# Patient Record
Sex: Male | Born: 1965 | Race: White | Hispanic: No | Smoking: Former smoker
Health system: Southern US, Community
[De-identification: ages and names within clinical notes are randomized; demographics above are authoritative.]

## PROBLEM LIST (undated history)

## (undated) DIAGNOSIS — C8440 Peripheral T-cell lymphoma, not classified, unspecified site: Secondary | ICD-10-CM

## (undated) DIAGNOSIS — J449 Chronic obstructive pulmonary disease, unspecified: Secondary | ICD-10-CM

## (undated) DIAGNOSIS — G894 Chronic pain syndrome: Secondary | ICD-10-CM

## (undated) DIAGNOSIS — C801 Malignant (primary) neoplasm, unspecified: Secondary | ICD-10-CM

## (undated) DIAGNOSIS — K5090 Crohn's disease, unspecified, without complications: Secondary | ICD-10-CM

## (undated) HISTORY — PX: BOWEL RESECTION: SHX1257

---

## 2006-08-21 ENCOUNTER — Emergency Department: Payer: Self-pay | Admitting: Emergency Medicine

## 2010-09-05 ENCOUNTER — Emergency Department: Payer: Self-pay | Admitting: Emergency Medicine

## 2010-10-04 ENCOUNTER — Emergency Department: Payer: Self-pay | Admitting: Unknown Physician Specialty

## 2010-10-12 ENCOUNTER — Inpatient Hospital Stay: Payer: Self-pay | Admitting: Internal Medicine

## 2010-11-06 ENCOUNTER — Emergency Department: Payer: Self-pay | Admitting: Emergency Medicine

## 2011-04-17 ENCOUNTER — Emergency Department: Payer: Self-pay | Admitting: Internal Medicine

## 2019-08-01 ENCOUNTER — Ambulatory Visit: Payer: Self-pay

## 2019-09-01 ENCOUNTER — Telehealth: Payer: Self-pay | Admitting: Primary Care

## 2019-09-01 NOTE — Telephone Encounter (Signed)
Spoke with patient's sister Ronie Spies [facility]) regarding Palliative services and she was in agreement with this.  I have scheduled an In-person Consult for 09/08/19 @ 3 PM

## 2019-09-08 ENCOUNTER — Other Ambulatory Visit: Payer: Medicare Other | Admitting: Primary Care

## 2019-09-09 ENCOUNTER — Other Ambulatory Visit: Payer: Medicare Other | Admitting: Primary Care

## 2019-09-09 ENCOUNTER — Other Ambulatory Visit: Payer: Self-pay

## 2019-09-09 DIAGNOSIS — Z515 Encounter for palliative care: Secondary | ICD-10-CM

## 2019-09-09 NOTE — Progress Notes (Signed)
[facility] Consult Note Telephone: [phone]  Fax: [phone]  PATIENT NAME: Gordon Rubio [address] [place] [address] [phone] (home) [phone] (work) DOB: [birth year] MRN: [id number]  PRIMARY CARE PROVIDER:    Einar Crow, MD,  [id number] Dept of Medicine [place] [address] [phone]  REFERRING PROVIDER:   Einar Crow, MD [id number] Dept of [place],  [place] [address] [phone]  RESPONSIBLE PARTY:   Extended Emergency Contact Information Primary Emergency Contact: BYRD,ROBERTA Address: [address]  [place] Home Phone: [phone] Mobile Phone: [phone] Relation: None Secondary Emergency Contact: AMORE,MISTY D Address: [address]          [place], [place] [address] Home Phone: [phone] Relation: None  I met with patient and family in the home.  ASSESSMENT AND RECOMMENDATIONS:   1. Advance Care Planning/Goals of Care: Goals include to maximize quality of life and symptom management.   I met with Gordon Rubio  and his sister Gordon Rubio, who was willing to discuss advance care planning.  Our advance care planning conversation included a discussion about:   . The value and importance of advance care planning  . Experiences with loved ones who have been seriously ill  . Exploration of personal, cultural or spiritual beliefs that might influence medical decisions   . Exploration of goals of care in the event of a sudden injury or illness  . Identification  of a healthcare agent  . Review of ,and [employer] book given, of an advance directive document . MOST left, he will review . Sister and he recounted care journey.    2. Symptom Management:   Dyspnea: Endorse worsening but also endorses anxiety. Oxygen at 3 L. Nebs at chair side. Discussed pursed lip breathing and Acapella with neb option to combine   resp. therapies. Recommend hydroxyzine in lieu of lorazepam.  Mobility: Using walker with good mobility. Gets out of bed ok (I). Denies falls. Very DOE. Has portable Oxygen but feels afraid to venture very far due to fear of malfunction of concentrator.   3. Family /Caregiver/Community Supports: Lives alone, has caregivers, sister is nearby and [place].  We discussed palliative and hospice as philosophies. He wants to address QOL in care decisions.  4. Cognitive / Functional decline: A and O x 3, able to do bath, dress with DOE. Can do some meal prep but losing stamina. Endorses faigue.  5. Follow up Palliative Care Visit: Palliative care will continue to follow for goals of care clarification and symptom management. Return 4 weeks or prn.  I spent 75 minutes providing this consultation,  from 1500 to 1615 . More than 50% of the time in this consultation was spent coordinating communication.   HISTORY OF PRESENT ILLNESS:  Gordon Rubio is a 54 y.o. year old male with multiple medical problems including COPD, lymphoma, anorexia, fatigue.Palliative Care was asked to follow this patient by consultation request of Zadie Cleverly* to help address advance care planning and goals of care. This is the initial visit.  CODE STATUS: TBD, MOSt left, will discuss  PPS: 40% HOSPICE ELIGIBILITY/DIAGNOSIS: TBD  PAST MEDICAL HISTORY: MI, depression, Crohn's diseaese, COPD, lymphoma, alpha-a antitrypsin deficiency,peripheral neuropathies, AKI, cva.  SOCIAL HX:  Social History   Tobacco Use  . Smoking status: Not on file  Substance Use Topics  . Alcohol use: Not on  file    ALLERGIES:  Allergies  Allergen Reactions  . Doxycycline     Severe arthralgia  . Sulfa Antibiotics     hallucinations    PERTINENT MEDICATIONS:  Outpatient Encounter Medications as of 09/09/2019  Medication Sig  . acetaminophen (TYLENOL) 325 MG tablet Take 650 mg by mouth every 6 (six) hours as needed for pain.  Marland Kitchen  albuterol (PROVENTIL) (2.5 MG/3ML) 0.083% nebulizer solution Inhale 2.5 mg into the lungs every 4 (four) hours as needed. wheezing or shortness of breath  . albuterol (VENTOLIN HFA) 108 (90 Base) MCG/ACT inhaler Inhale 2 puffs into the lungs every 4 (four) hours as needed for wheezing.  Marland Kitchen arformoterol (BROVANA) 15 MCG/2ML NEBU Inhale 15 mcg into the lungs in the morning and at bedtime.  . budesonide (ENTOCORT EC) 3 MG 24 hr capsule Take 9 mg by mouth daily.  . calcitonin, salmon, (MIACALCIN/FORTICAL) 200 UNIT/ACT nasal spray Place 1 spray into alternate nostrils daily.  . calcium citrate (CALCITRATE - DOSED IN MG ELEMENTAL CALCIUM) 950 (200 Ca) MG tablet Take 400 mg of elemental calcium by mouth in the morning and at bedtime.  . colestipol (COLESTID) 1 g tablet Take 1 g by mouth in the morning and at bedtime.  . cyanocobalamin 1000 MCG tablet Take 1,000 mcg by mouth daily.  . ergocalciferol (VITAMIN D2) 1.25 MG (50000 UT) capsule Take 50,000 Units by mouth every 7 (seven) days. On Sunday  . fluconazole (DIFLUCAN) 100 MG tablet Take 200 mg by mouth daily.  . furosemide (LASIX) 20 MG tablet Take 60 mg by mouth daily.  Marland Kitchen LORazepam (ATIVAN) 1 MG tablet Take 0.5 mg by mouth 3 (three) times daily.  . Magnesium Oxide 400 MG CAPS Take 1,600 mg by mouth daily.  . melatonin 3 MG TABS tablet Take 3 mg by mouth at bedtime. As needed for sleep  . mirtazapine (REMERON) 45 MG tablet Take 45 mg by mouth at bedtime.  . Multiple Vitamins-Minerals (THERA-M) TABS Take 1 tablet by mouth daily.  . naloxone (NARCAN) nasal spray 4 mg/0.1 mL Place 4 mg into the nose. AS NEEDED FOR OPIOID OVERDOSE.  Marland Kitchen OLANZapine (ZYPREXA) 10 MG tablet Take 10 mg by mouth at bedtime.  Marland Kitchen OLANZapine (ZYPREXA) 5 MG tablet Take 5 mg by mouth daily.  Marland Kitchen omeprazole (PRILOSEC) 20 MG capsule Take 20 mg by mouth daily.  Marland Kitchen oxyCODONE (OXYCONTIN) 60 MG 12 hr tablet Take 60 mg by mouth every 12 (twelve) hours.  . Oxycodone HCl 10 MG TABS Take 10 mg by  mouth 2 (two) times daily as needed for pain.  . OXYGEN Place 3 L into the nose continuous.  . potassium chloride (KLOR-CON) 10 MEQ tablet Take 10 mEq by mouth daily.  . revefenacin (YUPELRI) 175 MCG/3ML nebulizer solution Inhale 175 mcg into the lungs daily. nebulized  . sertraline (ZOLOFT) 100 MG tablet Take 100 mg by mouth daily.  . sodium chloride HYPERTONIC 3 % nebulizer solution Inhale 4 mLs into the lungs in the morning and at bedtime.  . thiamine 100 MG tablet Take 100 mg by mouth daily.   No facility-administered encounter medications on file as of 09/09/2019.    PHYSICAL EXAM / ROS:   Current and past weights: 116 lbs, states dry weight is 112 lbs.  General: NAD, frail appearing, thin Cardiovascular: no chest pain reported, no edema  Pulmonary: no cough, no increased SOB, oxygen at 3 L no humidity. Nebulizer routine. Tid, also has acapella Abdomen: appetite good, has nutritional supplements  denies constipation, continent of bowel,  GU: denies dysuria, continent of urine MSK:  no joint and ROM abnormalities, ambulatory with walker Skin: no rashes or wounds reported Neurological: Weakness, unrestorative sleep due to dyspnea, anxiety, depression.  Gordon Coop, NP [facility]  COVID-19 PATIENT SCREENING TOOL  Person answering questions: ____________Bill ______ _____   1.  Is the patient or any family member in the home showing any signs or symptoms regarding respiratory infection?               Person with Symptom- __________NA_________________  a. Fever                                                                          Yes___ No___          ___________________  b. Shortness of breath                                                    Yes___ No___          ___________________ c. Cough/congestion                                       Yes___  No___         ___________________ d. Body aches/pains                                                         Yes___ No___         ____________________ e. Gastrointestinal symptoms (diarrhea, nausea)           Yes___ No___        ____________________  2. Within the past 14 days, has anyone living in the home had any contact with someone with or under investigation for COVID-19?    Yes___ No_X_   Person __________________

## 2019-10-07 ENCOUNTER — Other Ambulatory Visit: Payer: Self-pay

## 2019-10-07 ENCOUNTER — Other Ambulatory Visit: Payer: Medicare Other | Admitting: Adult Health Nurse Practitioner

## 2019-10-07 DIAGNOSIS — J449 Chronic obstructive pulmonary disease, unspecified: Secondary | ICD-10-CM

## 2019-10-07 DIAGNOSIS — Z515 Encounter for palliative care: Secondary | ICD-10-CM

## 2019-10-07 NOTE — Progress Notes (Signed)
[facility] Consult Note Telephone: [phone]  Fax: [phone]  PATIENT NAME: Gordon Rubio DOB: Apr 24, 1966 MRN: [id number]  PRIMARY CARE PROVIDER:   Einar Crow, MD  REFERRING PROVIDER:  Einar Crow, MD [id number] Dept of [place],  [place] [address]  RESPONSIBLE PARTY:  Extended Emergency Contact Information Primary Emergency Contact: BYRD,ROBERTA Address: [address]  [place] Home Phone: [phone] Mobile Phone: [phone] Relation: None Secondary Emergency Contact: AMORE,MISTY D Address: [address]          [place], [place] [address] Home Phone: [phone] Relation: None     RECOMMENDATIONS and PLAN:  1.  Advanced care planning.  Patient is DNR.  Went over MOST form today.  Patient selected options for DNR, limited hospital interventions, antibiotics and IV fluids as indicated, no feeding tube.  MOST and DNR filled out today uploaded to [facility] and originals left with patient.  2.  COPD.  Patient has severe COPD with CO2 trapping.  Patient is oxygen dependent at 3 L.  Patient does get headaches and dizziness related to retaining CO2.  Patient currently on budesonide nebulizer daily, Brovana nebulizer twice daily, yulpleri, hypertonic sodium chloride nebulizer, and albuterol nebulizers as needed.  Patient has dyspnea on exertion and has to use techniques to conserve energy.  Patient has worked with OT in the past to help with techniques to conserve energy.  Patient ambulates with walker.  Patient does require assistance with ADLs sometimes.  States that showers are the worst and has to turn up his oxygen to 4 L.  Patient does sit down most of the day and has what he needs close by.  He does state that he becomes dyspneic while eating and talking.  Patient is being seen by Dr. Wynonia Musty with pulmonology.  Continue follow-up and recommendations by Dr. Wynonia Musty  3.   CHF.  Patient checks weight almost daily.  States that most days is around 112 will go up to 116 sometimes.  With the help of his sister has been able to get a good regimen of alternating days where he will take Lasix 20 mg 2 tabs 1 day and then alternate Lasix 20 milligrams 3 tabs the next.  Patient has trace edema today.  Denies chest pain, palpitations, orthopnea.  Patient seems to be doing well with this regimen of Lasix and monitors weights and edema to help make adjustments with his Lasix.  Did discuss taking an extra dose of Lasix with an increase in weight overnight of 3 pounds or more or with an increase in weight of 5 pounds or more in 1 week.  Continue monitoring and making adjustments to Lasix as needed.  4.  Pain.  Patient does have chronic back pain related to compression fractures.  He gets good pain relief with OxyContin 60 mg twice daily and oxycodone 10 mg daily as needed for breakthrough pain.  Continue current pain regimen.  5.  Falls.  Patient did have a fall about a week ago when getting up in the middle the night to go to the bathroom he tripped over his oxygen tubing.  He obtained several skin tears and ecchymosis to right arm which are healing.  Denies any other falls.  Have suggested using urinal at night to help prevent falls when getting up in the middle of the night to use the bathroom.  Patient appears stable at  this time.  Denies fever, increased cough, nausea, vomiting, diarrhea, constipation.  Patient has not had any recent hospitalizations.  Palliative will continue to monitor for symptom management/decline to make recommendations as needed.  Next appointment is in 6 weeks.  I spent 90 minutes providing this consultation,  from 9:00 to 10:30 including time spent with patient/family, chart review, provider coordination, documentation more than 50% of the time in this consultation was spent coordinating communication.   HISTORY OF PRESENT ILLNESS:  Gordon Rubio is a 54  y.o. year old male with multiple medical problems including COPD, lymphoma, CHF, CAD, Chron's disease. Palliative Care was asked to help address goals of care.   CODE STATUS: DNR  PPS: 40% HOSPICE ELIGIBILITY/DIAGNOSIS: TBD  PHYSICAL EXAM:  BP  128/62  HR 98  O2 94%on 3L General: NAD, frail appearing, thin Cardiovascular: regular rate and rhythm Pulmonary: lung sounds diminished but no abnormal breath sounds heard; normal respiratory effort Extremities: no edema, no joint deformities Skin: skin tears and ecchymosis to right arm post fall Neurological: Weakness but otherwise nonfocal   PAST MEDICAL HISTORY: No past medical history on file.  SOCIAL HX:  Social History   Tobacco Use  . Smoking status: Not on file  Substance Use Topics  . Alcohol use: Not on file    ALLERGIES:  Allergies  Allergen Reactions  . Doxycycline     Severe arthralgia  . Sulfa Antibiotics     hallucinations     PERTINENT MEDICATIONS:  Outpatient Encounter Medications as of 10/07/2019  Medication Sig  . acetaminophen (TYLENOL) 325 MG tablet Take 650 mg by mouth every 6 (six) hours as needed for pain.  Marland Kitchen albuterol (PROVENTIL) (2.5 MG/3ML) 0.083% nebulizer solution Inhale 2.5 mg into the lungs every 4 (four) hours as needed. wheezing or shortness of breath  . albuterol (VENTOLIN HFA) 108 (90 Base) MCG/ACT inhaler Inhale 2 puffs into the lungs every 4 (four) hours as needed for wheezing.  Marland Kitchen arformoterol (BROVANA) 15 MCG/2ML NEBU Inhale 15 mcg into the lungs in the morning and at bedtime.  . budesonide (ENTOCORT EC) 3 MG 24 hr capsule Take 9 mg by mouth daily.  . calcitonin, salmon, (MIACALCIN/FORTICAL) 200 UNIT/ACT nasal spray Place 1 spray into alternate nostrils daily.  . calcium citrate (CALCITRATE - DOSED IN MG ELEMENTAL CALCIUM) 950 (200 Ca) MG tablet Take 400 mg of elemental calcium by mouth in the morning and at bedtime.  . colestipol (COLESTID) 1 g tablet Take 1 g by mouth in the morning and at  bedtime.  . cyanocobalamin 1000 MCG tablet Take 1,000 mcg by mouth daily.  . ergocalciferol (VITAMIN D2) 1.25 MG (50000 UT) capsule Take 50,000 Units by mouth every 7 (seven) days. On Sunday  . fluconazole (DIFLUCAN) 100 MG tablet Take 200 mg by mouth daily.  . furosemide (LASIX) 20 MG tablet Take 60 mg by mouth daily.  Marland Kitchen LORazepam (ATIVAN) 1 MG tablet Take 0.5 mg by mouth 3 (three) times daily.  . Magnesium Oxide 400 MG CAPS Take 1,600 mg by mouth daily.  . melatonin 3 MG TABS tablet Take 3 mg by mouth at bedtime. As needed for sleep  . mirtazapine (REMERON) 45 MG tablet Take 45 mg by mouth at bedtime.  . Multiple Vitamins-Minerals (THERA-M) TABS Take 1 tablet by mouth daily.  . naloxone (NARCAN) nasal spray 4 mg/0.1 mL Place 4 mg into the nose. AS NEEDED FOR OPIOID OVERDOSE.  Marland Kitchen OLANZapine (ZYPREXA) 10 MG tablet Take 10 mg by mouth  at bedtime.  Marland Kitchen OLANZapine (ZYPREXA) 5 MG tablet Take 5 mg by mouth daily.  Marland Kitchen omeprazole (PRILOSEC) 20 MG capsule Take 20 mg by mouth daily.  Marland Kitchen oxyCODONE (OXYCONTIN) 60 MG 12 hr tablet Take 60 mg by mouth every 12 (twelve) hours.  . Oxycodone HCl 10 MG TABS Take 10 mg by mouth 2 (two) times daily as needed for pain.  . OXYGEN Place 3 L into the nose continuous.  . potassium chloride (KLOR-CON) 10 MEQ tablet Take 10 mEq by mouth daily.  . revefenacin (YUPELRI) 175 MCG/3ML nebulizer solution Inhale 175 mcg into the lungs daily. nebulized  . sertraline (ZOLOFT) 100 MG tablet Take 100 mg by mouth daily.  . sodium chloride HYPERTONIC 3 % nebulizer solution Inhale 4 mLs into the lungs in the morning and at bedtime.  . thiamine 100 MG tablet Take 100 mg by mouth daily.   No facility-administered encounter medications on file as of 10/07/2019.     Gordon Ouzts Jenetta Downer, NP

## 2019-10-09 ENCOUNTER — Other Ambulatory Visit: Payer: Medicare Other | Admitting: Primary Care

## 2019-11-18 ENCOUNTER — Other Ambulatory Visit: Payer: Self-pay

## 2019-11-18 ENCOUNTER — Other Ambulatory Visit: Payer: Medicare Other | Admitting: Adult Health Nurse Practitioner

## 2019-11-18 DIAGNOSIS — Z515 Encounter for palliative care: Secondary | ICD-10-CM

## 2019-11-18 DIAGNOSIS — J449 Chronic obstructive pulmonary disease, unspecified: Secondary | ICD-10-CM

## 2019-11-18 NOTE — Progress Notes (Signed)
[facility] Consult Note Telephone: [phone]  Fax: [phone]  PATIENT NAME: Gordon Rubio DOB: 01/23/1966 MRN: [id number]  PRIMARY CARE PROVIDER:   Einar Crow, MD  REFERRING PROVIDER:  Einar Crow, MD [id number] Dept of [place],  [place] [address]  RESPONSIBLE PARTY:   Extended Emergency Contact Information Primary Emergency Contact: BYRD,ROBERTA Address: [address] [place], [place] Home Phone: [phone] Mobile Phone: [phone] Relation: None Secondary Emergency Contact: AMORE,MISTY D Address: [address] [place], [place] [address] Home Phone: [phone] Relation: None     RECOMMENDATIONS and PLAN:  1.  Advanced care planning.  Patient is DNR.   2.  COPD stage IV GOLD B.  Patient with severe COPD with CO2 trapping.  Patient is oxygen dependent at 3 L.  Will increase to 4 L when taking a shower.  Patient is complaining of increased dizziness over the past 3 to 4 days along with increased urination.  States that he feels like he is in a fog.  States that urine is clear yellow with no unusual odor.  Denies fever, dysuria, hematuria, low back pain.  Patient has dyspnea with any activity including eating and talking.  Mainly stays in bed or in recliner to conserve energy.  Is able to ambulate with walker.  Uses pursed lipped breathing at rest.  Today's heart rate at rest was 105 and usually stays at 100 or higher at rest.  Requires assistance with ADLs and IADLs.  Have extended hours of caregiver that stays with him.  Patient complains today that he has not been properly instructed on how to use his trilogy machine.  And is having problems when he did skin ask his oxygen from the trilogy that he is not getting any oxygen until he resets his oxygen concentrator.  Have reached out to [facility] to see if someone can come to his home to instruct him on how to properly  use his trilogy machine.  Did later speak with his sister who states that someone from the previous has been there several times but he just has intolerance to using the trilogy machine.  He has had it for over a year and states that due to nonuse the trilogy machine will more than likely be given back to [place].  Sister states that his PCO2 levels are in the 80s and that since he cannot tolerate using the trilogy machine this will just get worse.  Patient's FEV1 is 34%.  Patient does state that he is tired of feeling like "it is the end."  We will reach out to hospice physicians to see if appropriate for hospice.  3.  CHF.  Patient's weight has been stable between 112 and 116 pounds.  He continues alternating his Lasix between 40 and 60 mg every other day.  Patient does have trace edema noted today.  Denies chest pain, palpitations, orthopnea.  He does monitor weights almost daily.  Continue monitoring and making adjustments to Lasix as needed.  Sister has concerns as she realizes the severity of his disease and knows that is not going to get any better especially with him not tolerating the trilogy machine.  States that with his increased forgetfulness and brain fog had had that he has had issues monitoring his medication usage.  Is using a hero machine that dispenses the medications he needs and locks it out after certain amount of time.  She also has concerned that he is  appetite has decreased.  His weights have been stable between 112 pounds to 116 pounds.  Have scheduled an appointment for this Friday afternoon so that patient sister can join [place] and further discuss his disease progression and to further go over goals of care.  I spent 80 minutes providing this consultation,  from 12:00 to 1:20 including time spent with patient/family, chart review, provider coordination, documentation. More than 50% of the time in this consultation was spent coordinating communication.   HISTORY OF PRESENT ILLNESS:   Gordon Rubio is a 54 y.o. year old male with multiple medical problems including COPD, lymphoma, CHF, CAD, Chron's disease. Palliative Care was asked to help address goals of care.   CODE STATUS: DNR  PPS: 40% HOSPICE ELIGIBILITY/DIAGNOSIS: TBD  PHYSICAL EXAM:  BP 110/60  HR 105  O2 98% on 3L General: NAD, frail appearing, thin Cardiovascular: regular rate and rhythm Pulmonary: lung sounds diminished but no abnormal breath sounds heard; using pursed lip breathing Abdomen: soft, nontender, + bowel sounds GU: no suprapubic tenderness Extremities:trace edema, no joint deformities Skin: no rashes on exposed skin Neurological: Weakness but otherwise nonfocal; does have forgetfulness   PAST MEDICAL HISTORY: No past medical history on file.  SOCIAL HX:  Social History   Tobacco Use   Smoking status: Not on file  Substance Use Topics   Alcohol use: Not on file    ALLERGIES:  Allergies  Allergen Reactions   Doxycycline     Severe arthralgia   Sulfa Antibiotics     hallucinations     PERTINENT MEDICATIONS:  Outpatient Encounter Medications as of 11/18/2019  Medication Sig   acetaminophen (TYLENOL) 325 MG tablet Take 650 mg by mouth every 6 (six) hours as needed for pain.   albuterol (PROVENTIL) (2.5 MG/3ML) 0.083% nebulizer solution Inhale 2.5 mg into the lungs every 4 (four) hours as needed. wheezing or shortness of breath   albuterol (VENTOLIN HFA) 108 (90 Base) MCG/ACT inhaler Inhale 2 puffs into the lungs every 4 (four) hours as needed for wheezing.   arformoterol (BROVANA) 15 MCG/2ML NEBU Inhale 15 mcg into the lungs in the morning and at bedtime.   calcitonin, salmon, (MIACALCIN/FORTICAL) 200 UNIT/ACT nasal spray Place 1 spray into alternate nostrils daily.   calcium citrate (CALCITRATE - DOSED IN MG ELEMENTAL CALCIUM) 950 (200 Ca) MG tablet Take 400 mg of elemental calcium by mouth in the morning and at bedtime.   colestipol (COLESTID) 1 g tablet Take 1 g by  mouth in the morning and at bedtime.   cyanocobalamin 1000 MCG tablet Take 1,000 mcg by mouth daily.   ergocalciferol (VITAMIN D2) 1.25 MG (50000 UT) capsule Take 50,000 Units by mouth every 7 (seven) days. On Sunday   fluconazole (DIFLUCAN) 100 MG tablet Take 200 mg by mouth daily.   furosemide (LASIX) 20 MG tablet Take 60 mg by mouth daily.   LORazepam (ATIVAN) 1 MG tablet Take 0.5 mg by mouth 3 (three) times daily.   Magnesium Oxide 400 MG CAPS Take 1,600 mg by mouth daily.   melatonin 3 MG TABS tablet Take 3 mg by mouth at bedtime. As needed for sleep   mirtazapine (REMERON) 45 MG tablet Take 45 mg by mouth at bedtime.   Multiple Vitamins-Minerals (THERA-M) TABS Take 1 tablet by mouth daily.   naloxone (NARCAN) nasal spray 4 mg/0.1 mL Place 4 mg into the nose. AS NEEDED FOR OPIOID OVERDOSE.   OLANZapine (ZYPREXA) 10 MG tablet Take 10 mg by mouth at bedtime.  OLANZapine (ZYPREXA) 5 MG tablet Take 5 mg by mouth daily.   omeprazole (PRILOSEC) 20 MG capsule Take 20 mg by mouth daily.   oxyCODONE (OXYCONTIN) 60 MG 12 hr tablet Take 60 mg by mouth every 12 (twelve) hours.   Oxycodone HCl 10 MG TABS Take 10 mg by mouth 2 (two) times daily as needed for pain.   OXYGEN Place 3 L into the nose continuous.   potassium chloride (KLOR-CON) 10 MEQ tablet Take 10 mEq by mouth daily.   revefenacin (YUPELRI) 175 MCG/3ML nebulizer solution Inhale 175 mcg into the lungs daily. nebulized   sertraline (ZOLOFT) 100 MG tablet Take 100 mg by mouth daily.   thiamine 100 MG tablet Take 100 mg by mouth daily.   No facility-administered encounter medications on file as of 11/18/2019.     Peachie Barkalow Jenetta Downer, NP

## 2019-11-21 ENCOUNTER — Other Ambulatory Visit: Payer: Medicare Other | Admitting: Adult Health Nurse Practitioner

## 2019-11-21 ENCOUNTER — Other Ambulatory Visit: Payer: Self-pay

## 2019-11-21 DIAGNOSIS — Z515 Encounter for palliative care: Secondary | ICD-10-CM

## 2019-11-21 DIAGNOSIS — J449 Chronic obstructive pulmonary disease, unspecified: Secondary | ICD-10-CM

## 2019-11-21 NOTE — Progress Notes (Signed)
[facility] Consult Note Telephone: [phone]  Fax: [phone]  PATIENT NAME: Gordon Rubio DOB: May 14, 1966 MRN: [id number]  PRIMARY CARE PROVIDER:   Einar Crow, MD  REFERRING PROVIDER:  Einar Crow, MD [address] Dept of [place],  [place] [address]  RESPONSIBLE PARTY:   Extended Emergency Contact Information Primary Emergency Contact: BYRD,ROBERTA Address: [place], [place] Home Phone: [phone] Mobile Phone: [phone] Relation: None Secondary Emergency Contact: AMORE,MISTY D Address: [address] [place], [place] [address] Home Phone: [phone] Relation: None    RECOMMENDATIONS and PLAN:  1.  Advanced care planning. Patient is DNR.   2.  COPD.  Patient has stage IV gold B COPD.  Sister states that he has been having several bad days in which he is very short of breath which causes anxiety and increases his shortness of breath.  More episodes of forgetfulness and brain fog.  She states that he is eating less and has lost 5 pounds over the past month and currently weighs 112 pounds down from 117 pounds.  Patient does state that eating is time-consuming and exhausting with the increased shortness of breath.  States that he has increased shortness of breath while talking, eating, drinking, and with activity.  States that when he does eat it will take him an hour to eat a few bites that he is eating.  Will take sips of fluids throughout the day.  States he can only take a few steps before having to sit down and rest.  He uses supplemental oxygen at 3 L.  Has trilogy machine to use at night but he has not been able to tolerate this and does not use it.  Have encouraged them to just send the machine back since is not being used.  Sister does state that over the weekend had an episode where he felt like "everything was upside down" and that he was  dizzy and nauseous.  He feels this way with the CO2 trapping and he is having more episodes like this.  His heart rate during these periods will stay elevated in the 120s and 130s.  Recent change in Ativan to 0.5 mg 3 times daily scheduled.  Visit today was with patient and his sister.  We discussed the severity of his disease and the support that hospice can give him.  Attempted to answer all their questions.  They did have some questions about insurance and medications I was unable to answer and will reach out to someone in hospice who can answer their questions more appropriately and accurately.  This was a very emotional conversation and this provider offered active listening and emotional support.  They are encouraged to call with any questions or concerns.  We will reach out to them next week to see if they have any further questions and if they would like to proceed with hospice support.  I spent 100 minutes providing this consultation,  from 3:30 to 5:10 including time spent with patient/family, chart review, provider coordination, documentation. More than 50% of the time in this consultation was spent coordinating communication.   HISTORY OF PRESENT ILLNESS:  Gordon Rubio is a 54 y.o. year old male with multiple medical problems including COPD, lymphoma,CHF, CAD, Chron's disease. Palliative Care was asked to help address goals of care.   CODE STATUS: DNR  PPS: 40% HOSPICE ELIGIBILITY/DIAGNOSIS: TBD  PHYSICAL EXAM:   General: NAD, frail appearing, thin; does use  pursed lip breathing at rest Extremities: no edema, no joint deformities Skin: no rashes on exposed skin Neurological: Weakness but otherwise nonfocal   PAST MEDICAL HISTORY: No past medical history on file.  SOCIAL HX:  Social History   Tobacco Use   Smoking status: Not on file  Substance Use Topics   Alcohol use: Not on file    ALLERGIES:  Allergies  Allergen Reactions   Doxycycline     Severe arthralgia     Sulfa Antibiotics     hallucinations     PERTINENT MEDICATIONS:  Outpatient Encounter Medications as of 11/21/2019  Medication Sig   acetaminophen (TYLENOL) 325 MG tablet Take 650 mg by mouth every 6 (six) hours as needed for pain.   albuterol (PROVENTIL) (2.5 MG/3ML) 0.083% nebulizer solution Inhale 2.5 mg into the lungs every 4 (four) hours as needed. wheezing or shortness of breath   albuterol (VENTOLIN HFA) 108 (90 Base) MCG/ACT inhaler Inhale 2 puffs into the lungs every 4 (four) hours as needed for wheezing.   arformoterol (BROVANA) 15 MCG/2ML NEBU Inhale 15 mcg into the lungs in the morning and at bedtime.   calcitonin, salmon, (MIACALCIN/FORTICAL) 200 UNIT/ACT nasal spray Place 1 spray into alternate nostrils daily.   calcium citrate (CALCITRATE - DOSED IN MG ELEMENTAL CALCIUM) 950 (200 Ca) MG tablet Take 400 mg of elemental calcium by mouth in the morning and at bedtime.   colestipol (COLESTID) 1 g tablet Take 1 g by mouth in the morning and at bedtime.   cyanocobalamin 1000 MCG tablet Take 1,000 mcg by mouth daily.   ergocalciferol (VITAMIN D2) 1.25 MG (50000 UT) capsule Take 50,000 Units by mouth every 7 (seven) days. On Sunday   fluconazole (DIFLUCAN) 100 MG tablet Take 200 mg by mouth daily.   furosemide (LASIX) 20 MG tablet Take 60 mg by mouth daily.   LORazepam (ATIVAN) 1 MG tablet Take 0.5 mg by mouth 3 (three) times daily.   Magnesium Oxide 400 MG CAPS Take 1,600 mg by mouth daily.   melatonin 3 MG TABS tablet Take 3 mg by mouth at bedtime. As needed for sleep   mirtazapine (REMERON) 45 MG tablet Take 45 mg by mouth at bedtime.   Multiple Vitamins-Minerals (THERA-M) TABS Take 1 tablet by mouth daily.   naloxone (NARCAN) nasal spray 4 mg/0.1 mL Place 4 mg into the nose. AS NEEDED FOR OPIOID OVERDOSE.   OLANZapine (ZYPREXA) 10 MG tablet Take 10 mg by mouth at bedtime.   OLANZapine (ZYPREXA) 5 MG tablet Take 5 mg by mouth daily.   omeprazole (PRILOSEC) 20  MG capsule Take 20 mg by mouth daily.   oxyCODONE (OXYCONTIN) 60 MG 12 hr tablet Take 60 mg by mouth every 12 (twelve) hours.   Oxycodone HCl 10 MG TABS Take 10 mg by mouth 2 (two) times daily as needed for pain.   OXYGEN Place 3 L into the nose continuous.   potassium chloride (KLOR-CON) 10 MEQ tablet Take 10 mEq by mouth daily.   revefenacin (YUPELRI) 175 MCG/3ML nebulizer solution Inhale 175 mcg into the lungs daily. nebulized   sertraline (ZOLOFT) 100 MG tablet Take 100 mg by mouth daily.   thiamine 100 MG tablet Take 100 mg by mouth daily.   No facility-administered encounter medications on file as of 11/21/2019.     Hamzeh Tall Jenetta Downer, NP

## 2019-12-01 ENCOUNTER — Telehealth: Payer: Self-pay | Admitting: Adult Health Nurse Practitioner

## 2019-12-01 NOTE — Telephone Encounter (Signed)
Sister called to discuss that she has spoken with Lorelee Market and Fredia Sorrow with [facility] to get more info about hospice and what her brother's insurance will cover.  He has appointment with pulmonology on 12/03/19 and a telehealth appointment with oncology on 12/12/19.  She would like him to have imaging done to see if the lymphoma has come back as he is having increased fatigue.  Discussed her calling back once imaging done so we could further discuss options moving forward. Deshara Rossi K. Olena Heckle NP

## 2019-12-08 ENCOUNTER — Telehealth: Payer: Self-pay | Admitting: Adult Health Nurse Practitioner

## 2019-12-08 NOTE — Telephone Encounter (Signed)
This is late entry.  Spoke with sister, Angelita Ingles, on 12/05/19.  She updated me on patient's visit with pulmonology and that Dr. Wynonia Musty is in agreement with hospice services as there are no other treatment options for his COPD.  He is not taking this information well and wants to pursue testing with oncology to see if his lymphoma has come back.  Sister to call back with update. Pammy Vesey K. Olena Heckle NP

## 2019-12-29 ENCOUNTER — Telehealth: Payer: Self-pay | Admitting: Adult Health Nurse Practitioner

## 2019-12-29 NOTE — Telephone Encounter (Signed)
Spoke with sister to set up appointment.  Patient has had PET scan done which did not show progression of his lymphoma but did show progression of his Crohn's disease.  Waiting for GI doctor to get back with him with any recommendations.  Set up appointment for 01/07/20 at 4pm.  Encouraged to call with any concerns. Haille Pardi K. Olena Heckle NP

## 2020-01-07 ENCOUNTER — Other Ambulatory Visit: Payer: Self-pay

## 2020-01-07 ENCOUNTER — Other Ambulatory Visit: Payer: Medicare Other | Admitting: Adult Health Nurse Practitioner

## 2020-01-07 DIAGNOSIS — Z515 Encounter for palliative care: Secondary | ICD-10-CM

## 2020-01-07 DIAGNOSIS — J449 Chronic obstructive pulmonary disease, unspecified: Secondary | ICD-10-CM

## 2020-01-08 NOTE — Progress Notes (Signed)
[facility] Consult Note Telephone: [phone]  Fax: [phone]  PATIENT NAME: Gordon Rubio DOB: July 06, 1965 MRN: [id number]  PRIMARY CARE PROVIDER:   Einar Crow, MD  REFERRING PROVIDER:  Einar Crow, MD [id number] Dept of [place],  [place] [address]  RESPONSIBLE PARTY:   Extended Emergency Contact Information Primary Emergency Contact: BYRD,ROBERTA Address: [place], [place] Home Phone: [phone] Mobile Phone: [phone] Relation: None Secondary Emergency Contact: AMORE,MISTY D Address: [place], [place] [address] Home Phone: [phone] Relation: None    RECOMMENDATIONS and PLAN:  1.  Advanced care planning. Patient is DNR.  2.  COPD.  Patient has stage IV gold B COPD. He uses supplemental oxygen at 3 L.  He gets very dyspneic with any activity and eating is exhausting.  He did go out to see a provider about a week ago and is still feeling increased fatigue.  Sister does state that he is sleeping more and stays in bed more often.  Discussed today at length hospice support and services.  Discussed options of staying on palliative or transitioning to hospice.  Answered their questions and promoted free expression of emotional aspect of transitioning to hospice.  Patient would like to stay with palliative at this time and look more into hospice on his own. States that he is not emotionally ready for hospice but feels like he will be in the future.  Will support him for as long as he needs.   Palliative will continue to monitor for symptom management/decline and make recommendations as needed.  Next appointment is in 4 weeks. Encouraged to call with any question or concerns    I spent 60 minutes providing this consultation,  from 4:00 to 5:00 including time spent with patient/family, chart review, provider coordination, documentation.  More than 50% of the time in this consultation was spent coordinating communication.   HISTORY OF PRESENT ILLNESS:  Gordon Rubio is a 54 y.o. year old male with multiple medical problems including COPD, lymphoma,CHF, CAD, Chron's disease. Palliative Care was asked to help address goals of care. In recent appointment with Dr. Wynonia Musty was told that there were no more treatments for his COPD, just symptom management.  Recent PET scan and appointment with oncologist showed that his lymphoma is stable.  Scan did show a couple nodules to left lung but treatment is not an option with as frail as he is.  Did recommend a repeat scan in 3 months to monitor.  Scan also showed that he has inflammation from his Crohn's disease and this is being managed with Entocort.  Is having some diarrhea that started yesterday.  Is having more belching and hiccupping.  Has had some increased coughing today. Denies fever, edema, N/V, dysuria.     CODE STATUS: DNR  PPS: 40% HOSPICE ELIGIBILITY/DIAGNOSIS: yes/severe COPD  PHYSICAL EXAM:   General: NAD, frail appearing, thin Cardiovascular: regular rate and rhythm Pulmonary: lung sounds diminished but no adventitious breath sounds heard Extremities: no edema, no joint deformities Neurological: Weakness but otherwise nonfocal   PAST MEDICAL HISTORY: No past medical history on file.  SOCIAL HX:  Social History   Tobacco Use  . Smoking status: Not on file  Substance Use Topics  . Alcohol use: Not on file    ALLERGIES:  Allergies  Allergen Reactions  . Doxycycline     Severe arthralgia  . Sulfa Antibiotics     hallucinations  PERTINENT MEDICATIONS:  Outpatient Encounter Medications as of 01/07/2020  Medication Sig  . acetaminophen (TYLENOL) 325 MG tablet Take 650 mg by mouth every 6 (six) hours as needed for pain.  Marland Kitchen albuterol (VENTOLIN HFA) 108 (90 Base) MCG/ACT inhaler Inhale 2 puffs into the lungs every 4 (four) hours as needed for wheezing.  Marland Kitchen  arformoterol (BROVANA) 15 MCG/2ML NEBU Inhale 15 mcg into the lungs in the morning and at bedtime.  . calcitonin, salmon, (MIACALCIN/FORTICAL) 200 UNIT/ACT nasal spray Place 1 spray into alternate nostrils daily.  . calcium citrate (CALCITRATE - DOSED IN MG ELEMENTAL CALCIUM) 950 (200 Ca) MG tablet Take 400 mg of elemental calcium by mouth in the morning and at bedtime.  . colestipol (COLESTID) 1 g tablet Take 1 g by mouth in the morning and at bedtime.  . cyanocobalamin 1000 MCG tablet Take 1,000 mcg by mouth daily.  . ergocalciferol (VITAMIN D2) 1.25 MG (50000 UT) capsule Take 50,000 Units by mouth every 7 (seven) days. On Sunday  . fluconazole (DIFLUCAN) 100 MG tablet Take 200 mg by mouth daily.  . furosemide (LASIX) 20 MG tablet Take 60 mg by mouth daily.  Marland Kitchen LORazepam (ATIVAN) 1 MG tablet Take 0.5 mg by mouth 3 (three) times daily.  . Magnesium Oxide 400 MG CAPS Take 1,600 mg by mouth daily.  . melatonin 3 MG TABS tablet Take 3 mg by mouth at bedtime. As needed for sleep  . Multiple Vitamins-Minerals (THERA-M) TABS Take 1 tablet by mouth daily.  . naloxone (NARCAN) nasal spray 4 mg/0.1 mL Place 4 mg into the nose. AS NEEDED FOR OPIOID OVERDOSE.  Marland Kitchen OLANZapine (ZYPREXA) 10 MG tablet Take 10 mg by mouth at bedtime.  Marland Kitchen oxyCODONE (OXYCONTIN) 60 MG 12 hr tablet Take 60 mg by mouth every 12 (twelve) hours.  . Oxycodone HCl 10 MG TABS Take 10 mg by mouth 2 (two) times daily as needed for pain.  . OXYGEN Place 3 L into the nose continuous.  . revefenacin (YUPELRI) 175 MCG/3ML nebulizer solution Inhale 175 mcg into the lungs daily. nebulized  . sertraline (ZOLOFT) 100 MG tablet Take 100 mg by mouth daily.  Marland Kitchen thiamine 100 MG tablet Take 100 mg by mouth daily.   No facility-administered encounter medications on file as of 01/07/2020.     Elizibeth Breau Jenetta Downer, NP

## 2020-02-11 ENCOUNTER — Other Ambulatory Visit: Payer: Self-pay

## 2020-02-11 ENCOUNTER — Other Ambulatory Visit: Payer: Medicare Other | Admitting: Adult Health Nurse Practitioner

## 2020-02-11 DIAGNOSIS — Z515 Encounter for palliative care: Secondary | ICD-10-CM

## 2020-02-11 DIAGNOSIS — J449 Chronic obstructive pulmonary disease, unspecified: Secondary | ICD-10-CM

## 2020-02-11 NOTE — Progress Notes (Signed)
[facility] Consult Note Telephone: [phone]  Fax: [phone]  PATIENT NAME: Gordon Rubio DOB: 03-27-1966 MRN: [id number]  PRIMARY CARE PROVIDER:   Einar Crow, MD  REFERRING PROVIDER:  Einar Crow, MD [address] Dept of [place],  [place] [address]  RESPONSIBLE PARTY:   Extended Emergency Contact Information Primary Emergency Contact: BYRD,ROBERTA Address: [address] [place], [place] Home Phone: [phone] Mobile Phone: [phone] Relation: None Secondary Emergency Contact: AMORE,MISTY D Address: [place], [place] [address] Home Phone: [phone] Relation: None    RECOMMENDATIONS and PLAN:  1.  Advanced care planning. Patient is DNR.  2.  COPD. Patient has stage IV gold B COPD. He uses supplemental oxygen at 3 L.  He gets very dyspneic with any activity and eating is exhausting.  He is having more bad days than good days.  Spends a lot of time in bed and bathing is exhausting.  Encouraged to allow caregiver that comes in to help him with bathing to conserve energy.    3.  Nutritional status.  Patient has lost weight from 116 to 108.  States that his lower jaw protrudes outward and this has caused a misalignment of his teeth and does cause some pain with chewing.  Has been eating more soft foods.    Patient has appointment with PCP, Dr Durward Parcel, on 02/27/20 to discuss his condition and option of hospice.  Patient has choices of who to be attending once he goes to hospice as his some of his specialists have already said that they would be willing to be his attending.  Have appointment after scheduled in 2 1/2 weeks to discuss hospice further.    I spent 50 minutes providing this consultation,  from 4:00 to 4:50 including time spent with patient/family, chart review, provider coordination, documentation. More than 50% of the time in this  consultation was spent coordinating communication.   HISTORY OF PRESENT ILLNESS:  Gordon Rubio is a 54 y.o. year old male with multiple medical problems including COPD, lymphoma,CHF, CAD, Chron's disease. Palliative Care was asked to help address goals of care.  Patient has weak bones. Patient having displacement of lower jaw and also has caused a rib fracture when he put his arm behind his back. Already on Oxycontin for pain relief.  Patient having increased exhaustion and SOB with any activity.  Having longer periods in which he sleeps.  More days of staying in bed.    CODE STATUS: DNR  PPS: 40% HOSPICE ELIGIBILITY/DIAGNOSIS: yes/ end stage COPD  PHYSICAL EXAM:  BP  112/60  HR 103  O2 96% on 3L General: NAD, frail appearing, thin Cardiovascular: regular rate and rhythm Pulmonary: lung sounds diminished but no adventitious breath sounds heard Extremities: no edema, no joint deformities Skin: no rashes on exposed skin Neurological: Weakness but otherwise nonfocal   PAST MEDICAL HISTORY: No past medical history on file.  SOCIAL HX:  Social History   Tobacco Use  . Smoking status: Not on file  Substance Use Topics  . Alcohol use: Not on file    ALLERGIES:  Allergies  Allergen Reactions  . Doxycycline     Severe arthralgia  . Sulfa Antibiotics     hallucinations     PERTINENT MEDICATIONS:  Outpatient Encounter Medications as of 02/11/2020  Medication Sig  . acetaminophen (TYLENOL) 325 MG tablet Take 650 mg by mouth every 6 (six) hours as needed for pain.  Marland Kitchen  albuterol (PROVENTIL) (2.5 MG/3ML) 0.083% nebulizer solution Inhale 2.5 mg into the lungs every 4 (four) hours as needed. wheezing or shortness of breath  . albuterol (VENTOLIN HFA) 108 (90 Base) MCG/ACT inhaler Inhale 2 puffs into the lungs every 4 (four) hours as needed for wheezing.  Marland Kitchen arformoterol (BROVANA) 15 MCG/2ML NEBU Inhale 15 mcg into the lungs in the morning and at bedtime.  . calcitonin, salmon,  (MIACALCIN/FORTICAL) 200 UNIT/ACT nasal spray Place 1 spray into alternate nostrils daily.  . calcium citrate (CALCITRATE - DOSED IN MG ELEMENTAL CALCIUM) 950 (200 Ca) MG tablet Take 400 mg of elemental calcium by mouth in the morning and at bedtime.  . colestipol (COLESTID) 1 g tablet Take 1 g by mouth in the morning and at bedtime.  . cyanocobalamin 1000 MCG tablet Take 1,000 mcg by mouth daily.  . ergocalciferol (VITAMIN D2) 1.25 MG (50000 UT) capsule Take 50,000 Units by mouth every 7 (seven) days. On Sunday  . fluconazole (DIFLUCAN) 100 MG tablet Take 200 mg by mouth daily.  . furosemide (LASIX) 20 MG tablet Take 60 mg by mouth daily.  Marland Kitchen LORazepam (ATIVAN) 1 MG tablet Take 0.5 mg by mouth 3 (three) times daily.  . Magnesium Oxide 400 MG CAPS Take 1,600 mg by mouth daily.  . melatonin 3 MG TABS tablet Take 3 mg by mouth at bedtime. As needed for sleep  . mirtazapine (REMERON) 45 MG tablet Take 45 mg by mouth at bedtime.  . Multiple Vitamins-Minerals (THERA-M) TABS Take 1 tablet by mouth daily.  . naloxone (NARCAN) nasal spray 4 mg/0.1 mL Place 4 mg into the nose. AS NEEDED FOR OPIOID OVERDOSE.  Marland Kitchen OLANZapine (ZYPREXA) 10 MG tablet Take 10 mg by mouth at bedtime.  Marland Kitchen OLANZapine (ZYPREXA) 5 MG tablet Take 5 mg by mouth daily.  Marland Kitchen omeprazole (PRILOSEC) 20 MG capsule Take 20 mg by mouth daily.  Marland Kitchen oxyCODONE (OXYCONTIN) 60 MG 12 hr tablet Take 60 mg by mouth every 12 (twelve) hours.  . Oxycodone HCl 10 MG TABS Take 10 mg by mouth 2 (two) times daily as needed for pain.  . OXYGEN Place 3 L into the nose continuous.  . potassium chloride (KLOR-CON) 10 MEQ tablet Take 10 mEq by mouth daily.  . revefenacin (YUPELRI) 175 MCG/3ML nebulizer solution Inhale 175 mcg into the lungs daily. nebulized  . sertraline (ZOLOFT) 100 MG tablet Take 100 mg by mouth daily.  Marland Kitchen thiamine 100 MG tablet Take 100 mg by mouth daily.   No facility-administered encounter medications on file as of 02/11/2020.     Cathan Gearin Jenetta Downer, NP

## 2020-02-27 ENCOUNTER — Telehealth: Payer: Self-pay | Admitting: Adult Health Nurse Practitioner

## 2020-02-27 NOTE — Telephone Encounter (Signed)
This is late entry.  Spoke with patient's sister yesterday.  Patient had visit with his PCP, Dr. Danise Edge.  She had advised patient that hospice would be good support for him.  Patient continues to sleep more and has lost about 6 more pounds and currently weighs 106 pounds from baseline of 112-117.  He is on board with hospice services now.  Have reached out to Dr. Babs Sciara office with request for hospice referral and if he will continue to be attending. Bennett Ram K. Olena Heckle NP

## 2020-03-01 ENCOUNTER — Other Ambulatory Visit: Payer: Medicare Other | Admitting: Adult Health Nurse Practitioner

## 2020-03-01 ENCOUNTER — Other Ambulatory Visit: Payer: Self-pay

## 2020-03-01 DIAGNOSIS — Z515 Encounter for palliative care: Secondary | ICD-10-CM

## 2020-03-01 DIAGNOSIS — J449 Chronic obstructive pulmonary disease, unspecified: Secondary | ICD-10-CM

## 2020-03-01 NOTE — Progress Notes (Signed)
[facility] Consult Note Telephone: [phone]  Fax: [phone]  PATIENT NAME: Gordon Rubio DOB: [birth year] MRN: [id number]  PRIMARY CARE PROVIDER:   Einar Crow, MD  REFERRING PROVIDER:  Einar Crow, MD [id number] Dept of [place],  [place] [address]  RESPONSIBLE PARTY:  Extended Emergency Contact Information Primary Emergency Contact: BYRD,ROBERTA Address: [address] [place], [place] Home Phone: [phone] Mobile Phone: [phone] Relation: None Secondary Emergency Contact: AMORE,MISTY D Address: [place] [place], [place] [address] Home Phone: [phone] Relation: None    RECOMMENDATIONS and PLAN:  1.  Advanced care planning. Patient is DNR.  2.  This visit today was with hospice admission nurse.  Joined hospice nurse, patient, and family for visit at request of family.  Helped to answer questions about services, medications, and offered support.  Patient admitting to hospice services today.   I spent 60 minutes providing this consultation,  from 1:00 to 2:00 including time spent with patient/family, chart review, provider coordination, documentation. More than 50% of the time in this consultation was spent coordinating communication.   HISTORY OF PRESENT ILLNESS:  Gordon Rubio is a 54 y.o. year old male with multiple medical problems including COPD, lymphoma,CHF, CAD, Chron's disease. Palliative Care was asked to help address goals of care.  Patient continuing to have increased dyspnea with eating and talking.  Sleeping more.    CODE STATUS: DNR  PPS: 40% HOSPICE ELIGIBILITY/DIAGNOSIS: yes/end stage COPD  PHYSICAL EXAM:   General: NAD, frail appearing, thin Extremities: no edema, no joint deformities Skin: no rashes on exposed skin Neurological: Weakness but otherwise nonfocal  PAST MEDICAL HISTORY: No past medical history on file.   SOCIAL HX:  Social History   Tobacco Use  . Smoking status: Not on file  Substance Use Topics  . Alcohol use: Not on file    ALLERGIES:  Allergies  Allergen Reactions  . Doxycycline     Severe arthralgia  . Sulfa Antibiotics     hallucinations     PERTINENT MEDICATIONS:  Outpatient Encounter Medications as of 03/01/2020  Medication Sig  . acetaminophen (TYLENOL) 325 MG tablet Take 650 mg by mouth every 6 (six) hours as needed for pain.  Marland Kitchen albuterol (PROVENTIL) (2.5 MG/3ML) 0.083% nebulizer solution Inhale 2.5 mg into the lungs every 4 (four) hours as needed. wheezing or shortness of breath  . albuterol (VENTOLIN HFA) 108 (90 Base) MCG/ACT inhaler Inhale 2 puffs into the lungs every 4 (four) hours as needed for wheezing.  Marland Kitchen arformoterol (BROVANA) 15 MCG/2ML NEBU Inhale 15 mcg into the lungs in the morning and at bedtime.  . calcitonin, salmon, (MIACALCIN/FORTICAL) 200 UNIT/ACT nasal spray Place 1 spray into alternate nostrils daily.  . calcium citrate (CALCITRATE - DOSED IN MG ELEMENTAL CALCIUM) 950 (200 Ca) MG tablet Take 400 mg of elemental calcium by mouth in the morning and at bedtime.  . colestipol (COLESTID) 1 g tablet Take 1 g by mouth in the morning and at bedtime.  . cyanocobalamin 1000 MCG tablet Take 1,000 mcg by mouth daily.  . ergocalciferol (VITAMIN D2) 1.25 MG (50000 UT) capsule Take 50,000 Units by mouth every 7 (seven) days. On Sunday  . fluconazole (DIFLUCAN) 100 MG tablet Take 200 mg by mouth daily.  . furosemide (LASIX) 20 MG tablet Take 60 mg by mouth daily.  Marland Kitchen LORazepam (ATIVAN) 1 MG tablet Take 0.5 mg by mouth 3 (three) times daily.  . Magnesium Oxide  400 MG CAPS Take 1,600 mg by mouth daily.  . melatonin 3 MG TABS tablet Take 3 mg by mouth at bedtime. As needed for sleep  . mirtazapine (REMERON) 45 MG tablet Take 45 mg by mouth at bedtime.  . Multiple Vitamins-Minerals (THERA-M) TABS Take 1 tablet by mouth daily.  . naloxone (NARCAN) nasal spray 4 mg/0.1  mL Place 4 mg into the nose. AS NEEDED FOR OPIOID OVERDOSE.  Marland Kitchen OLANZapine (ZYPREXA) 10 MG tablet Take 10 mg by mouth at bedtime.  Marland Kitchen OLANZapine (ZYPREXA) 5 MG tablet Take 5 mg by mouth daily.  Marland Kitchen omeprazole (PRILOSEC) 20 MG capsule Take 20 mg by mouth daily.  Marland Kitchen oxyCODONE (OXYCONTIN) 60 MG 12 hr tablet Take 60 mg by mouth every 12 (twelve) hours.  . Oxycodone HCl 10 MG TABS Take 10 mg by mouth 2 (two) times daily as needed for pain.  . OXYGEN Place 3 L into the nose continuous.  . potassium chloride (KLOR-CON) 10 MEQ tablet Take 10 mEq by mouth daily.  . revefenacin (YUPELRI) 175 MCG/3ML nebulizer solution Inhale 175 mcg into the lungs daily. nebulized  . sertraline (ZOLOFT) 100 MG tablet Take 100 mg by mouth daily.  Marland Kitchen thiamine 100 MG tablet Take 100 mg by mouth daily.   No facility-administered encounter medications on file as of 03/01/2020.      Kyilee Gregg Jenetta Downer, NP

## 2020-04-16 ENCOUNTER — Other Ambulatory Visit: Payer: Self-pay

## 2020-04-16 ENCOUNTER — Inpatient Hospital Stay
Admission: EM | Admit: 2020-04-16 | Discharge: 2020-04-22 | DRG: 480 | Disposition: A | Discharge status: Skilled Nursing Facility | Source: Skilled Nursing Facility | Attending: Family Medicine | Admitting: Family Medicine

## 2020-04-16 ENCOUNTER — Emergency Department

## 2020-04-16 ENCOUNTER — Encounter: Payer: Self-pay | Admitting: Medical Oncology

## 2020-04-16 DIAGNOSIS — E8801 Alpha-1-antitrypsin deficiency: Secondary | ICD-10-CM | POA: Diagnosis present

## 2020-04-16 DIAGNOSIS — M8088XA Other osteoporosis with current pathological fracture, vertebra(e), initial encounter for fracture: Secondary | ICD-10-CM | POA: Diagnosis present

## 2020-04-16 DIAGNOSIS — W19XXXA Unspecified fall, initial encounter: Secondary | ICD-10-CM

## 2020-04-16 DIAGNOSIS — E876 Hypokalemia: Secondary | ICD-10-CM

## 2020-04-16 DIAGNOSIS — M069 Rheumatoid arthritis, unspecified: Secondary | ICD-10-CM | POA: Diagnosis present

## 2020-04-16 DIAGNOSIS — Z87891 Personal history of nicotine dependence: Secondary | ICD-10-CM

## 2020-04-16 DIAGNOSIS — G9341 Metabolic encephalopathy: Secondary | ICD-10-CM | POA: Diagnosis present

## 2020-04-16 DIAGNOSIS — Z87442 Personal history of urinary calculi: Secondary | ICD-10-CM

## 2020-04-16 DIAGNOSIS — F418 Other specified anxiety disorders: Secondary | ICD-10-CM | POA: Diagnosis not present

## 2020-04-16 DIAGNOSIS — K08109 Complete loss of teeth, unspecified cause, unspecified class: Secondary | ICD-10-CM | POA: Diagnosis present

## 2020-04-16 DIAGNOSIS — K529 Noninfective gastroenteritis and colitis, unspecified: Secondary | ICD-10-CM | POA: Diagnosis present

## 2020-04-16 DIAGNOSIS — Z20822 Contact with and (suspected) exposure to covid-19: Secondary | ICD-10-CM | POA: Diagnosis present

## 2020-04-16 DIAGNOSIS — Z9981 Dependence on supplemental oxygen: Secondary | ICD-10-CM

## 2020-04-16 DIAGNOSIS — Z681 Body mass index (BMI) 19 or less, adult: Secondary | ICD-10-CM

## 2020-04-16 DIAGNOSIS — F32A Depression, unspecified: Secondary | ICD-10-CM | POA: Diagnosis present

## 2020-04-16 DIAGNOSIS — E43 Unspecified severe protein-calorie malnutrition: Secondary | ICD-10-CM | POA: Diagnosis present

## 2020-04-16 DIAGNOSIS — Z882 Allergy status to sulfonamides status: Secondary | ICD-10-CM

## 2020-04-16 DIAGNOSIS — Z881 Allergy status to other antibiotic agents status: Secondary | ICD-10-CM

## 2020-04-16 DIAGNOSIS — Z825 Family history of asthma and other chronic lower respiratory diseases: Secondary | ICD-10-CM

## 2020-04-16 DIAGNOSIS — G894 Chronic pain syndrome: Secondary | ICD-10-CM | POA: Diagnosis present

## 2020-04-16 DIAGNOSIS — Z66 Do not resuscitate: Secondary | ICD-10-CM | POA: Diagnosis present

## 2020-04-16 DIAGNOSIS — J9611 Chronic respiratory failure with hypoxia: Secondary | ICD-10-CM | POA: Diagnosis present

## 2020-04-16 DIAGNOSIS — Z79899 Other long term (current) drug therapy: Secondary | ICD-10-CM

## 2020-04-16 DIAGNOSIS — W010XXA Fall on same level from slipping, tripping and stumbling without subsequent striking against object, initial encounter: Secondary | ICD-10-CM | POA: Diagnosis present

## 2020-04-16 DIAGNOSIS — I959 Hypotension, unspecified: Secondary | ICD-10-CM | POA: Diagnosis not present

## 2020-04-16 DIAGNOSIS — S72141A Displaced intertrochanteric fracture of right femur, initial encounter for closed fracture: Principal | ICD-10-CM | POA: Diagnosis present

## 2020-04-16 DIAGNOSIS — C8440 Peripheral T-cell lymphoma, not classified, unspecified site: Secondary | ICD-10-CM | POA: Diagnosis present

## 2020-04-16 DIAGNOSIS — D62 Acute posthemorrhagic anemia: Secondary | ICD-10-CM | POA: Diagnosis not present

## 2020-04-16 DIAGNOSIS — I5021 Acute systolic (congestive) heart failure: Secondary | ICD-10-CM | POA: Diagnosis not present

## 2020-04-16 DIAGNOSIS — E875 Hyperkalemia: Secondary | ICD-10-CM | POA: Diagnosis not present

## 2020-04-16 DIAGNOSIS — S72001A Fracture of unspecified part of neck of right femur, initial encounter for closed fracture: Secondary | ICD-10-CM | POA: Diagnosis not present

## 2020-04-16 DIAGNOSIS — K5090 Crohn's disease, unspecified, without complications: Secondary | ICD-10-CM | POA: Diagnosis present

## 2020-04-16 DIAGNOSIS — S72001D Fracture of unspecified part of neck of right femur, subsequent encounter for closed fracture with routine healing: Secondary | ICD-10-CM | POA: Diagnosis not present

## 2020-04-16 DIAGNOSIS — F419 Anxiety disorder, unspecified: Secondary | ICD-10-CM | POA: Diagnosis present

## 2020-04-16 DIAGNOSIS — J431 Panlobular emphysema: Secondary | ICD-10-CM | POA: Diagnosis present

## 2020-04-16 DIAGNOSIS — J449 Chronic obstructive pulmonary disease, unspecified: Secondary | ICD-10-CM

## 2020-04-16 DIAGNOSIS — R0602 Shortness of breath: Secondary | ICD-10-CM

## 2020-04-16 HISTORY — DX: Crohn's disease, unspecified, without complications: K50.90

## 2020-04-16 HISTORY — DX: Chronic pain syndrome: G89.4

## 2020-04-16 HISTORY — DX: Malignant (primary) neoplasm, unspecified: C80.1

## 2020-04-16 HISTORY — DX: Chronic obstructive pulmonary disease, unspecified: J44.9

## 2020-04-16 HISTORY — DX: Peripheral t-cell lymphoma, not elsewhere classified, unspecified site: C84.40

## 2020-04-16 LAB — RESP PANEL BY RT-PCR (FLU A&B, COVID) ARPGX2
Influenza A by PCR: NEGATIVE
Influenza B by PCR: NEGATIVE
SARS Coronavirus 2 by RT PCR: NEGATIVE

## 2020-04-16 LAB — COMPREHENSIVE METABOLIC PANEL
ALT: 18 U/L (ref 0–44)
AST: 33 U/L (ref 15–41)
Albumin: 2.1 g/dL — ABNORMAL LOW (ref 3.5–5.0)
Alkaline Phosphatase: 116 U/L (ref 38–126)
Anion gap: 15 (ref 5–15)
BUN: 7 mg/dL (ref 6–20)
CO2: 40 mmol/L — ABNORMAL HIGH (ref 22–32)
Calcium: 5.4 mg/dL — CL (ref 8.9–10.3)
Chloride: 79 mmol/L — ABNORMAL LOW (ref 98–111)
Creatinine, Ser: 0.64 mg/dL (ref 0.61–1.24)
GFR, Estimated: >60 mL/min (ref >60)
Glucose, Bld: 126 mg/dL — ABNORMAL HIGH (ref 70–99)
Potassium: 2.2 mmol/L — CL (ref 3.5–5.1)
Sodium: 134 mmol/L — ABNORMAL LOW (ref 135–145)
Total Bilirubin: 0.7 mg/dL (ref 0.3–1.2)
Total Protein: 5.1 g/dL — ABNORMAL LOW (ref 6.5–8.1)

## 2020-04-16 LAB — TYPE AND SCREEN
ABO/RH(D): A POS
Antibody Screen: NEGATIVE

## 2020-04-16 LAB — MAGNESIUM: Magnesium: 0.6 mg/dL — CL (ref 1.7–2.4)

## 2020-04-16 LAB — CBC
HCT: 31.7 % — ABNORMAL LOW (ref 39.0–52.0)
Hemoglobin: 9.9 g/dL — ABNORMAL LOW (ref 13.0–17.0)
MCH: 27.4 pg (ref 26.0–34.0)
MCHC: 31.2 g/dL (ref 30.0–36.0)
MCV: 87.8 fL (ref 80.0–100.0)
Platelets: 175 10*3/uL (ref 150–400)
RBC: 3.61 MIL/uL — ABNORMAL LOW (ref 4.22–5.81)
RDW: 12.7 % (ref 11.5–15.5)
WBC: 7.5 10*3/uL (ref 4.0–10.5)
nRBC: 0 % (ref 0.0–0.2)

## 2020-04-16 LAB — APTT: aPTT: 28 seconds (ref 24–36)

## 2020-04-16 LAB — BRAIN NATRIURETIC PEPTIDE: B Natriuretic Peptide: 50.3 pg/mL (ref 0.0–100.0)

## 2020-04-16 LAB — PROTIME-INR
INR: 1 (ref 0.8–1.2)
Prothrombin Time: 12.7 seconds (ref 11.4–15.2)

## 2020-04-16 LAB — PHOSPHORUS: Phosphorus: 2.8 mg/dL (ref 2.5–4.6)

## 2020-04-16 MED ORDER — DM-GUAIFENESIN ER 30-600 MG PO TB12
1.0000 | ORAL_TABLET | Freq: Two times a day (BID) | ORAL | Status: DC | PRN
  Filled 2020-04-16: qty 1

## 2020-04-16 MED ORDER — SODIUM CHLORIDE 0.9 % IV BOLUS
1000.0000 mL | Freq: Once | INTRAVENOUS | Status: AC
  Administered 2020-04-16: 1000 mL via INTRAVENOUS

## 2020-04-16 MED ORDER — ACETAMINOPHEN 325 MG PO TABS: 650.0000 mg | ORAL_TABLET | Freq: Four times a day (QID) | ORAL | Status: DC | PRN

## 2020-04-16 MED ORDER — MAGNESIUM SULFATE 2 GM/50ML IV SOLN
2.0000 g | Freq: Once | INTRAVENOUS | Status: AC
  Administered 2020-04-16: 2 g via INTRAVENOUS
  Filled 2020-04-16: qty 50

## 2020-04-16 MED ORDER — POTASSIUM CHLORIDE CRYS ER 20 MEQ PO TBCR
40.0000 meq | EXTENDED_RELEASE_TABLET | ORAL | Status: AC
  Administered 2020-04-16 (×2): 40 meq via ORAL
  Filled 2020-04-16 (×2): qty 2

## 2020-04-16 MED ORDER — HYDROMORPHONE HCL 1 MG/ML IJ SOLN
1.0000 mg | Freq: Once | INTRAMUSCULAR | Status: AC
  Administered 2020-04-16: 1 mg via INTRAVENOUS
  Filled 2020-04-16: qty 1

## 2020-04-16 MED ORDER — ONDANSETRON HCL 4 MG/2ML IJ SOLN
4.0000 mg | Freq: Once | INTRAMUSCULAR | Status: AC
  Administered 2020-04-16: 4 mg via INTRAVENOUS
  Filled 2020-04-16: qty 2

## 2020-04-16 MED ORDER — METHOCARBAMOL 500 MG PO TABS
500.0000 mg | ORAL_TABLET | Freq: Three times a day (TID) | ORAL | Status: DC | PRN
  Administered 2020-04-17 – 2020-04-19 (×3): 500 mg via ORAL
  Filled 2020-04-16 (×4): qty 1

## 2020-04-16 MED ORDER — MORPHINE SULFATE (PF) 2 MG/ML IV SOLN
1.0000 mg | INTRAVENOUS | Status: DC | PRN
  Administered 2020-04-16 – 2020-04-18 (×6): 1 mg via INTRAVENOUS
  Filled 2020-04-16 (×6): qty 1

## 2020-04-16 MED ORDER — POTASSIUM CHLORIDE 10 MEQ/100ML IV SOLN
10.0000 meq | INTRAVENOUS | Status: AC
  Administered 2020-04-16: 10 meq via INTRAVENOUS
  Filled 2020-04-16: qty 100

## 2020-04-16 MED ORDER — ALBUTEROL SULFATE HFA 108 (90 BASE) MCG/ACT IN AERS
2.0000 | INHALATION_SPRAY | RESPIRATORY_TRACT | Status: DC | PRN
  Administered 2020-04-17: 2 via RESPIRATORY_TRACT
  Filled 2020-04-16 (×2): qty 6.7

## 2020-04-16 MED ORDER — OXYCODONE-ACETAMINOPHEN 5-325 MG PO TABS
1.0000 | ORAL_TABLET | Freq: Four times a day (QID) | ORAL | Status: DC | PRN
  Administered 2020-04-16 – 2020-04-17 (×2): 1 via ORAL
  Filled 2020-04-16 (×2): qty 1

## 2020-04-16 MED ORDER — ONDANSETRON HCL 4 MG/2ML IJ SOLN: 4.0000 mg | Freq: Three times a day (TID) | INTRAMUSCULAR | Status: DC | PRN

## 2020-04-16 MED ORDER — HYDROMORPHONE HCL 1 MG/ML IJ SOLN
1.0000 mg | INTRAMUSCULAR | Status: AC
  Administered 2020-04-16: 1 mg via INTRAVENOUS
  Filled 2020-04-16: qty 1

## 2020-04-16 MED ORDER — ENSURE ENLIVE PO LIQD
237.0000 mL | Freq: Two times a day (BID) | ORAL | Status: DC
  Administered 2020-04-17 – 2020-04-19 (×4): 237 mL via ORAL

## 2020-04-16 MED ORDER — POTASSIUM CHLORIDE 10 MEQ/100ML IV SOLN: 10.0000 meq | Freq: Once | INTRAVENOUS | Status: DC

## 2020-04-16 MED ORDER — MORPHINE SULFATE (PF) 2 MG/ML IV SOLN
2.0000 mg | Freq: Once | INTRAVENOUS | Status: AC
  Administered 2020-04-16: 2 mg via SUBCUTANEOUS
  Filled 2020-04-16: qty 1

## 2020-04-16 MED ORDER — CALCIUM GLUCONATE-NACL 1-0.675 GM/50ML-% IV SOLN
1.0000 g | Freq: Once | INTRAVENOUS | Status: AC
  Administered 2020-04-16: 1000 mg via INTRAVENOUS
  Filled 2020-04-16: qty 50

## 2020-04-16 MED ORDER — SODIUM CHLORIDE 0.9 % IV SOLN: INTRAVENOUS | Status: DC

## 2020-04-16 MED ORDER — CALCIUM GLUCONATE-NACL 2-0.675 GM/100ML-% IV SOLN
2.0000 g | Freq: Once | INTRAVENOUS | Status: AC
  Administered 2020-04-16: 2000 mg via INTRAVENOUS
  Filled 2020-04-16 (×2): qty 100

## 2020-04-16 MED ORDER — SENNOSIDES-DOCUSATE SODIUM 8.6-50 MG PO TABS: 1.0000 | ORAL_TABLET | Freq: Every evening | ORAL | Status: DC | PRN

## 2020-04-16 MED ORDER — POTASSIUM CHLORIDE CRYS ER 20 MEQ PO TBCR
40.0000 meq | EXTENDED_RELEASE_TABLET | Freq: Once | ORAL | Status: DC
  Filled 2020-04-16: qty 2

## 2020-04-16 NOTE — H&P (Addendum)
History and Physical    Gordon Rubio ERX:[id number] DOB: 05-22-1965 DOA: 04/16/2020  Referring MD/NP/PA:   PCP: Einar Crow, MD   Patient coming from:  The patient is coming from SNF.  At baseline, pt is dependent for most of ADL.        Chief Complaint: Fall, right hip pain  HPI: Gordon Rubio is a 54 y.o. 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  Pt states that he fell when was walking and tripped his steps. He hit his head, and injured his right hip. He developed right hip pain, which is constant, sharp, severe, nonradiating. No loss of consciousness. No unilateral numbness or tingling in his extremities. Patient has chronic cough with some greenish colored mucus production, denies worsening shortness of breath, chest pain, fever or chills. Patient has chronic mild diarrhea due to Crohn's disease, which has not changed. He has nausea, no vomiting or abdominal pain. No symptoms of UTI.  ED Course: pt was found to have WBC 7.5, pending COVID-19 PCR, potassium 2.2, magnesium of 0.6, calcium of 5.4, temperature normal, blood pressure 102/84, heart rate 107, RR 20, oxygen saturation 99% on home level of 3 L oxygen. CT head is negative for acute intracranial abnormalities. Chest x-ray negative. X-ray of right hip/pelvis showed comminuted right intertrochanteric fracture.  Review of Systems:   General: no fevers, chills, no body weight gain, has fatigue HEENT: no blurry vision, hearing changes or sore throat Respiratory: has dyspnea, coughing, no wheezing CV: no chest pain, no palpitations GI: has nausea and diarrhea, no vomiting, abdominal pain, constipation GU: no dysuria, burning on urination, increased urinary frequency, hematuria  Ext: no leg edema Neuro: no  unilateral weakness, numbness, or tingling, no vision change or hearing loss. Has fall. Skin: no rash, no skin tear. MSK: has right hip pain. Heme: No easy bruising.  Travel history: No recent long distant travel.  Allergy:  Allergies  Allergen Reactions  . Doxycycline     Severe arthralgia  . Sulfa Antibiotics     hallucinations  . Sulfamethoxazole     hallucinations   . Glycopyrrolate-Formoterol Palpitations    Patient reports immediate palpitations after use    Past Medical History:  Diagnosis Date  . Cancer ([facility])   . Chronic pain syndrome   . COPD (chronic obstructive pulmonary disease) ([facility])   . Crohn's disease ([facility])   . Peripheral T-cell lymphoma [facility])     Past Surgical History:  Procedure Laterality Date  . BOWEL RESECTION      Social History:  reports that he has quit smoking. He has never used smokeless tobacco. He reports previous alcohol use. He reports that he does not use drugs.  Family History:  Family History  Problem Relation Age of Onset  . COPD Mother   . Stroke Father      Prior to Admission medications   Medication Sig Start Date End Date Taking? Authorizing Provider  acetaminophen (TYLENOL) 325 MG tablet Take 650 mg by mouth every 6 (six) hours as needed for pain. 01/12/19   [provider]  albuterol (PROVENTIL) (2.5 MG/3ML) 0.083% nebulizer solution Inhale 2.5 mg into the lungs every 4 (four) hours as needed. wheezing or shortness of breath 11/11/18 11/11/19  [provider]  albuterol (VENTOLIN HFA) 108 (90 Base) MCG/ACT inhaler  Inhale 2 puffs into the lungs every 4 (four) hours as needed for wheezing. 07/07/19   [provider]  arformoterol (BROVANA) 15 MCG/2ML NEBU Inhale 15 mcg into the lungs in the morning and at bedtime. 05/13/19 05/12/20  [provider]  calcitonin, salmon, (MIACALCIN/FORTICAL) 200 UNIT/ACT nasal spray Place 1 spray into alternate nostrils daily. 06/04/19 06/03/20  [provider]   calcium citrate (CALCITRATE - DOSED IN MG ELEMENTAL CALCIUM) 950 (200 Ca) MG tablet Take 400 mg of elemental calcium by mouth in the morning and at bedtime. 06/04/19 06/03/20  [provider]  colestipol (COLESTID) 1 g tablet Take 1 g by mouth in the morning and at bedtime. 01/12/19   [provider]  cyanocobalamin 1000 MCG tablet Take 1,000 mcg by mouth daily.    [provider]  ergocalciferol (VITAMIN D2) 1.25 MG (50000 UT) capsule Take 50,000 Units by mouth every 7 (seven) days. On Sunday    [provider]  fluconazole (DIFLUCAN) 100 MG tablet Take 200 mg by mouth daily. 10/14/18   [provider]  furosemide (LASIX) 20 MG tablet Take 60 mg by mouth daily. 09/05/19 09/04/20  [provider]  LORazepam (ATIVAN) 1 MG tablet Take 0.5 mg by mouth 3 (three) times daily. 08/04/19   [provider]  Magnesium Oxide 400 MG CAPS Take 1,600 mg by mouth daily.    [provider]  melatonin 3 MG TABS tablet Take 3 mg by mouth at bedtime. As needed for sleep 08/12/18   [provider]  mirtazapine (REMERON) 45 MG tablet Take 45 mg by mouth at bedtime. 08/04/19 10/08/19  [provider]  Multiple Vitamins-Minerals (THERA-M) TABS Take 1 tablet by mouth daily. 01/23/17   [provider]  naloxone [facility]) nasal spray 4 mg/0.1 mL Place 4 mg into the nose. AS NEEDED FOR OPIOID OVERDOSE. 03/21/18   [provider]  OLANZapine (ZYPREXA) 10 MG tablet Take 10 mg by mouth at bedtime. 07/15/19   [provider]  OLANZapine (ZYPREXA) 5 MG tablet Take 5 mg by mouth daily. 07/15/19 10/08/19  [provider]  omeprazole (PRILOSEC) 20 MG capsule Take 20 mg by mouth daily. 08/04/19 10/08/19  [provider]  oxyCODONE (OXYCONTIN) 60 MG 12 hr tablet Take 60 mg by mouth every 12 (twelve) hours. 08/19/19   [provider]  Oxycodone HCl 10 MG TABS Take 10 mg by mouth 2 (two) times daily as needed for  pain. 01/24/19   [provider]  OXYGEN Place 3 L into the nose continuous.    [provider]  potassium chloride (KLOR-CON) 10 MEQ tablet Take 10 mEq by mouth daily. 12/12/18 12/12/19  [provider]  revefenacin (YUPELRI) 175 MCG/3ML nebulizer solution Inhale 175 mcg into the lungs daily. nebulized 03/17/19   [provider]  sertraline (ZOLOFT) 100 MG tablet Take 100 mg by mouth daily. 08/04/19   [provider]  thiamine 100 MG tablet Take 100 mg by mouth daily. 08/03/17   [provider]    Physical Exam: Vitals:   04/16/20 1500 04/16/20 1530 04/16/20 1600 04/16/20 1655  BP: 104/72 101/69 108/80 103/77  Pulse: (!) 109 (!) 107 (!) 113 (!) 107  Resp: (!) 22 (!) 22 20 18   Temp: 98.1 F (36.7 C)   97.9 F (36.6 C)  TempSrc: Oral   Oral  SpO2: 100% 96% 92% 97%  Weight:      Height:       General: Not  in acute distress. Very thin body habitus HEENT:       Eyes: PERRL, EOMI, no scleral icterus.       ENT: No discharge from the ears and nose, no pharynx injection, no tonsillar enlargement.        Neck: No JVD, no bruit, no mass felt. Heme: No neck lymph node enlargement. Cardiac: S1/S2, RRR, No murmurs, No gallops or rubs. Respiratory: Severely decreased eye movement bilaterally. GI: Soft, nondistended, nontender, no rebound pain, no organomegaly, BS present. GU: No hematuria Ext: No pitting leg edema bilaterally. 1+DP/PT pulse bilaterally. Musculoskeletal: Has tenderness in the right hip, right leg is shortened and externally rotated.  Skin: No rashes.  Neuro: Alert, oriented X3, cranial nerves II-XII grossly intact, moves all extremities.  Psych: Patient is not psychotic, no suicidal or hemocidal ideation.  Labs on Admission: I have personally reviewed following labs and imaging studies  CBC: Recent Labs  Lab 04/16/20 1348  WBC 7.5  HGB 9.9*  HCT 31.7*  MCV 87.8  PLT 270   Basic Metabolic Panel: Recent Labs  Lab  04/16/20 1348  NA 134*  K 2.2*  CL 79*  CO2 40*  GLUCOSE 126*  BUN 7  CREATININE 0.64  CALCIUM 5.4*  MG 0.6*  PHOS 2.8   GFR: Estimated Creatinine Clearance: 57 mL/min (by C-G formula based on SCr of 0.64 mg/dL). Liver Function Tests: Recent Labs  Lab 04/16/20 1348  AST 33  ALT 18  ALKPHOS 116  BILITOT 0.7  PROT 5.1*  ALBUMIN 2.1*   No results for input(s): LIPASE, AMYLASE in the last 168 hours. No results for input(s): AMMONIA in the last 168 hours. Coagulation Profile: No results for input(s): INR, PROTIME in the last 168 hours. Cardiac Enzymes: No results for input(s): CKTOTAL, CKMB, CKMBINDEX, TROPONINI in the last 168 hours. BNP (last 3 results) No results for input(s): PROBNP in the last 8760 hours. HbA1C: No results for input(s): HGBA1C in the last 72 hours. CBG: No results for input(s): GLUCAP in the last 168 hours. Lipid Profile: No results for input(s): CHOL, HDL, LDLCALC, TRIG, CHOLHDL, LDLDIRECT in the last 72 hours. Thyroid Function Tests: No results for input(s): TSH, T4TOTAL, FREET4, T3FREE, THYROIDAB in the last 72 hours. Anemia Panel: No results for input(s): VITAMINB12, FOLATE, FERRITIN, TIBC, IRON, RETICCTPCT in the last 72 hours. Urine analysis: No results found for: COLORURINE, APPEARANCEUR, LABSPEC, PHURINE, GLUCOSEU, HGBUR, BILIRUBINUR, KETONESUR, PROTEINUR, UROBILINOGEN, NITRITE, LEUKOCYTESUR Sepsis Labs: @LABRCNTIP (procalcitonin:4,lacticidven:4) ) Recent Results (from the past 240 hour(s))  Resp Panel by RT-PCR (Flu A&B, Covid) Nasopharyngeal Swab     Status: None   Collection Time: 04/16/20  2:41 PM   Specimen: Nasopharyngeal Swab; Nasopharyngeal(NP) swabs in vial transport medium  Result Value Ref Range Status   SARS Coronavirus 2 by RT PCR NEGATIVE NEGATIVE Final    Comment: (NOTE) SARS-CoV-2 target nucleic acids are NOT DETECTED.  The SARS-CoV-2 RNA is generally detectable in upper respiratory specimens during the acute phase of  infection. The lowest concentration of SARS-CoV-2 viral copies this assay can detect is 138 copies/mL. A negative result does not preclude SARS-Cov-2 infection and should not be used as the sole basis for treatment or other patient management decisions. A negative result may occur with  improper specimen collection/handling, submission of specimen other than nasopharyngeal swab, presence of viral mutation(s) within the areas targeted by this assay, and inadequate number of viral copies(<138 copies/mL). A negative result must be combined with clinical observations, patient history, and epidemiological information. The  expected result is Negative.  Fact Sheet for Patients:  EntrepreneurPulse.com.au  Fact Sheet for Healthcare Providers:  IncredibleEmployment.be  This test is no t yet approved or cleared by the [place] FDA and  has been authorized for detection and/or diagnosis of SARS-CoV-2 by FDA under an Emergency Use Authorization (EUA). This EUA will remain  in effect (meaning this test can be used) for the duration of the COVID-19 declaration under Section 564(b)(1) of the Act, 21 U.S.C.section 360bbb-3(b)(1), unless the authorization is terminated  or revoked sooner.       Influenza A by PCR NEGATIVE NEGATIVE Final   Influenza B by PCR NEGATIVE NEGATIVE Final    Comment: (NOTE) The Xpert Xpress SARS-CoV-2/FLU/RSV plus assay is intended as an aid in the diagnosis of influenza from Nasopharyngeal swab specimens and should not be used as a sole basis for treatment. Nasal washings and aspirates are unacceptable for Xpert Xpress SARS-CoV-2/FLU/RSV testing.  Fact Sheet for Patients: EntrepreneurPulse.com.au  Fact Sheet for Healthcare Providers: IncredibleEmployment.be  This test is not yet approved or cleared by the [place] FDA and has been authorized for detection and/or diagnosis of SARS-CoV-2  by FDA under an Emergency Use Authorization (EUA). This EUA will remain in effect (meaning this test can be used) for the duration of the COVID-19 declaration under Section 564(b)(1) of the Act, 21 U.S.C. section 360bbb-3(b)(1), unless the authorization is terminated or revoked.  Performed at [facility], [address], [place], [place] [address]      Radiological Exams on Admission: DG Chest 1 View  Result Date: 04/16/2020 CLINICAL DATA:  Right hip pain after a fall today. EXAM: CHEST  1 VIEW COMPARISON:  None. FINDINGS: A Port-A-Cath terminates over the right atrium. The cardiac silhouette is normal in size. There is mild prominence of the central pulmonary arteries. No airspace consolidation, edema, pleural effusion, or pneumothorax is identified. Surgical clips are noted in the right upper abdomen. No acute osseous abnormality is identified. IMPRESSION: No active disease. Electronically Signed   By: Logan Bores M.D.   On: 04/16/2020 15:05   CT Head Wo Contrast  Result Date: 04/16/2020 CLINICAL DATA:  Head trauma, minor, normal mental status. Additional history provided: Fall. EXAM: CT HEAD WITHOUT CONTRAST TECHNIQUE: Contiguous axial images were obtained from the base of the skull through the vertex without intravenous contrast. COMPARISON:  No pertinent prior exams available for comparison. FINDINGS: Brain: The examination is mildly motion degraded. There is no acute intracranial hemorrhage. No demarcated cortical infarct. No extra-axial fluid collection. No evidence of intracranial mass. No midline shift. Vascular: No hyperdense vessel. Skull: Normal. Negative for fracture or focal lesion. Sinuses/Orbits: Visualized orbits show no acute finding. Tiny bilateral maxillary sinus mucous retention cyst. Trace right mastoid effusion IMPRESSION: Motion degraded examination. No evidence of acute intracranial abnormality. Trace right mastoid effusion. Electronically Signed   By: Kellie Simmering DO   On: 04/16/2020 14:21   DG HIP UNILAT W OR W/O PELVIS 2-3 VIEWS RIGHT  Result Date: 04/16/2020 CLINICAL DATA:  [place] on right hip. EXAM: DG HIP (WITH OR WITHOUT PELVIS) 2-3V RIGHT COMPARISON:  None. FINDINGS: Bones are diffusely demineralized. Comminuted right intertrochanteric hip fracture noted with varus angulation. Pubic rami unremarkable. IMPRESSION: Comminuted right intertrochanteric hip fracture with varus angulation. Electronically Signed   By: Misty Stanley M.D.   On: 04/16/2020 15:05     EKG: I have personally reviewed. Sinus rhythm, QTC 465, low voltage, nonspecific T wave change.  Assessment/Plan Principal Problem:  Closed right hip fracture [facility]) Active Problems:   Fall   Peripheral T-cell lymphoma ([place])   End stage COPD ([place])   Depression with anxiety   Hypokalemia   Hypocalcemia   Crohn disease ([place])   Hypomagnesemia   Chronic pain syndrome   Protein-calorie malnutrition, severe ([facility])   Closed right hip fracture ([place]): X-ray showed comminuted right intertrochanteric fracture. Orthopedic surgery, Dr. Benay Pike is consulted. Given patient history of end-stage of COPD, severe protein caloric malnutrition, severe electrolyte disturbance, patient is very poor candidate for surgery currently. I consulted pulmonologist, Dr. Lanney Gins for further evaluation uppercase of pulmonary function.  - will admit to Med-surg bed - Pain control: morphine prn and percocet - When necessary Zofran for nausea - Robaxin for muscle spasm - type and cross - INR/PTT - PT/OT when able to (not ordered now)  Fall: CT head negative for acute intracranial abnormalities. -PT/OT when able to  Peripheral T-cell lymphoma [facility]): Patient had immunotherapy and chemotherapy in the past, currently not receiving any treatment. Patient is in hospice care. -Follow-up with CBC  End stage COPD ([place]) -Continue bronchodilators -Follow-up pulmonologist recommendations  Depression with  anxiety -Continue home medications  Electrolytes disturbance: Including hypokalemia with a potassium 2.2, hypocalcemia with calcium of 5.4, hypomagnesemia with magnesium 0.6 -Repleted potassium with IV 10 mEq of potassium chloride x 2 and oral potassium chloride 40 mEq x 3 -Magnesium sulfate 2 g x 2 -Calcium gluconate 3, total -Check phosphorus level -hold lasix  Crohn disease (H[facility]) -Entocort  Chronic pain syndrome: -continue MS Contin  Protein-calorie malnutrition, severe: BMI 12.55. -start Ensure -consult to nutrition     DVT ppx: SCD Code Status: DNR (per his sister. Pt has DNR paper from facility) Family Communication: Yes, patient's sister   at bed side Disposition Plan:  Anticipate discharge back to previous SNF environment Consults called:  Dr. Rudene Christians of ortho and Dr. Keenan Bachelor of pulmonology Admission status: Med-surg bed as inpt     Status is: Inpatient  Remains inpatient appropriate because:Inpatient level of care appropriate due to severity of illness   Dispo: The patient is from: SNF              Anticipated d/c is to: SNF              Anticipated d/c date is: 2 days              Patient currently is not medically stable to d/c.          Date of Service 04/16/2020    Ivor Costa [facility]   If 7PM-7AM, please contact night-coverage [email or web address] 04/16/2020, 5:22 PM

## 2020-04-16 NOTE — ED Triage Notes (Signed)
Pt from [facility] facility- was standing and lost balance injuring his rt hip. Pt reports pain, noted outward rotation. Pt is hospice patient for End stage COPD.

## 2020-04-16 NOTE — ED Notes (Signed)
Full rainbow including type and screen sent to lab.

## 2020-04-16 NOTE — ED Provider Notes (Signed)
[facility] Emergency Department Provider Note  Time seen: 1:42 PM  I have reviewed the triage vital signs and the nursing notes.   HISTORY  Chief Complaint Hip Pain   HPI Gordon Rubio is a 54 y.o. 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  According to the patient he was walking when he tripped and fell onto his right side.  Patient did hit his head but denies LOC.  Patient states significant pain 10/10 aching and sharp pain in the right hip.  Patient has a deformity to the right hip.  Shortened and externally rotated right lower extremity.  Patient denies pain anywhere else, besides his chronic pain.  No past medical history on file.  There are no problems to display for this patient.    Prior to Admission medications   Medication Sig Start Date End Date Taking? Authorizing Provider  acetaminophen (TYLENOL) 325 MG tablet Take 650 mg by mouth every 6 (six) hours as needed for pain. 01/12/19   [provider]  albuterol (PROVENTIL) (2.5 MG/3ML) 0.083% nebulizer solution Inhale 2.5 mg into the lungs every 4 (four) hours as needed. wheezing or shortness of breath 11/11/18 11/11/19  [provider]  albuterol (VENTOLIN HFA) 108 (90 Base) MCG/ACT inhaler Inhale 2 puffs into the lungs every 4 (four) hours as needed for wheezing. 07/07/19   [provider]  arformoterol (BROVANA) 15 MCG/2ML NEBU Inhale 15 mcg into the lungs in the morning and at bedtime. 05/13/19 05/12/20  [provider]  calcitonin, salmon, (MIACALCIN/FORTICAL) 200 UNIT/ACT nasal spray Place 1 spray into alternate nostrils daily. 06/04/19 06/03/20  [provider]  calcium citrate (CALCITRATE - DOSED IN MG ELEMENTAL CALCIUM) 950 (200 Ca) MG tablet Take 400 mg of elemental calcium by mouth in the morning  and at bedtime. 06/04/19 06/03/20  [provider]  colestipol (COLESTID) 1 g tablet Take 1 g by mouth in the morning and at bedtime. 01/12/19   [provider]  cyanocobalamin 1000 MCG tablet Take 1,000 mcg by mouth daily.    [provider]  ergocalciferol (VITAMIN D2) 1.25 MG (50000 UT) capsule Take 50,000 Units by mouth every 7 (seven) days. On Sunday    [provider]  fluconazole (DIFLUCAN) 100 MG tablet Take 200 mg by mouth daily. 10/14/18   [provider]  furosemide (LASIX) 20 MG tablet Take 60 mg by mouth daily. 09/05/19 09/04/20  [provider]  LORazepam (ATIVAN) 1 MG tablet Take 0.5 mg by mouth 3 (three) times daily. 08/04/19   [provider]  Magnesium Oxide 400 MG CAPS Take 1,600 mg by mouth daily.    [provider]  melatonin 3 MG TABS tablet Take 3 mg by mouth at bedtime. As needed for sleep 08/12/18   [provider]  mirtazapine (REMERON) 45 MG tablet Take 45 mg by mouth at bedtime. 08/04/19 10/08/19  [provider]  Multiple Vitamins-Minerals (THERA-M) TABS Take 1 tablet by mouth daily. 01/23/17   [provider]  naloxone [facility]) nasal spray 4 mg/0.1 mL Place 4 mg into the nose. AS NEEDED FOR OPIOID OVERDOSE. 03/21/18   [provider]  OLANZapine (ZYPREXA) 10 MG tablet Take 10 mg by mouth at bedtime. 07/15/19   [provider]  OLANZapine (ZYPREXA) 5 MG tablet Take 5 mg by mouth  daily. 07/15/19 10/08/19  [provider]  omeprazole (PRILOSEC) 20 MG capsule Take 20 mg by mouth daily. 08/04/19 10/08/19  [provider]  oxyCODONE (OXYCONTIN) 60 MG 12 hr tablet Take 60 mg by mouth every 12 (twelve) hours. 08/19/19   [provider]  Oxycodone HCl 10 MG TABS Take 10 mg by mouth 2 (two) times daily as needed for pain. 01/24/19   [provider]  OXYGEN Place 3 L into the nose continuous.    [provider]  potassium chloride (KLOR-CON)  10 MEQ tablet Take 10 mEq by mouth daily. 12/12/18 12/12/19  [provider]  revefenacin (YUPELRI) 175 MCG/3ML nebulizer solution Inhale 175 mcg into the lungs daily. nebulized 03/17/19   [provider]  sertraline (ZOLOFT) 100 MG tablet Take 100 mg by mouth daily. 08/04/19   [provider]  thiamine 100 MG tablet Take 100 mg by mouth daily. 08/03/17   [provider]    Allergies  Allergen Reactions  . Doxycycline     Severe arthralgia  . Sulfa Antibiotics     hallucinations    No family history on file.  Social History Social History   Tobacco Use  . Smoking status: Not on file  Substance Use Topics  . Alcohol use: Not on file  . Drug use: Not on file    Review of Systems Constitutional: Negative for loss of consciousness. Cardiovascular: Negative for chest pain. Respiratory: Negative for shortness of breath. Gastrointestinal: Negative for abdominal pain Musculoskeletal: Severe right hip pain.  Worse with any attempted movement. Skin: Negative for skin complaints  Neurological: Negative for headache All other ROS negative  ____________________________________________   PHYSICAL EXAM:  Constitutional: Alert and oriented. Well appearing and in no distress. Eyes: Normal exam ENT      Head: Normocephalic and atraumatic.      Mouth/Throat: Mucous membranes are moist. Cardiovascular: Normal rate, regular rhythm.  Respiratory: Normal respiratory effort without tachypnea nor retractions. Breath sounds are clear  Gastrointestinal: Soft and nontender. No distention.   Musculoskeletal: Moderate tenderness palpation of the right hip with moderate swelling to the right hip consistent with likely fracture.  Neurovascularly intact distally with 2+ DP pulse. Neurologic:  Normal speech and language. No gross focal neurologic deficits  Skin:  Skin is warm, dry and intact.  Psychiatric: Mood and affect are normal.    ____________________________________________    EKG  EKG viewed and interpreted by myself shows sinus tachycardia 106 bpm with a narrow QRS, normal axis, normal intervals, nonspecific ST changes.  ____________________________________________    RADIOLOGY  IMPRESSION:  Motion degraded examination.   No evidence of acute intracranial abnormality.   Trace right mastoid effusion.   ____________________________________________   INITIAL IMPRESSION / ASSESSMENT AND PLAN / ED COURSE  Pertinent labs & imaging results that were available during my care of the patient were reviewed by me and considered in my medical decision making (see chart for details).  Patient fell while attempting to walk today landing on his right hip. Exam consistent with hip fracture we will obtain x-ray to confirm. Patient's lab work shows significant hypokalemia and hypocalcemia we will replete, we will IV hydrate. Patient will require admission regardless.  Hip appears to have a femoral neck fracture.  Dr. Rudene Christians is currently in the operating room but will see the patient.  We will admit to the hospital service.  TREYTEN MONESTIME was evaluated in Emergency Department on 04/16/2020 for the symptoms described in the history of  present illness. He was evaluated in the context of the global COVID-19 pandemic, which necessitated consideration that the patient might be at risk for infection with the SARS-CoV-2 virus that causes COVID-19. Institutional protocols and algorithms that pertain to the evaluation of patients at risk for COVID-19 are in a state of rapid change based on information released by regulatory bodies including the CDC and federal and state organizations. These policies and algorithms were followed during the patient's care in the ED.  ____________________________________________   FINAL CLINICAL IMPRESSION(S) / ED DIAGNOSES  Hypokalemia Hypocalcemia Hip fracture   Harvest Dark,  MD 04/16/20 1448

## 2020-04-16 NOTE — Consult Note (Signed)
Reason for Consult: Right intertrochanteric hip fracture Referring Physician: Dr. Audelia Acton is an 54 y.o. male.  HPI: Patient is a 54 year old who suffered a fall earlier today.  He was unable to bear weight following this and had x-rays in the emergency room showing a somewhat comminuted high intertrochanteric hip fracture with severe osteopenia.  He reports a history of needing to take prednisone for his lungs as well as Crohn's disease he has severe COPD related to multiple problems and some deficiency and smoking as well as working as Clinical biochemist.  He is currently in hospice secondary to this.  He complains of severe pain and has some chronic pain which for which he takes OxyContin normally.  He normally is able to ambulate.  Past Medical History:  Diagnosis Date   Cancer ([place])    COPD (chronic obstructive pulmonary disease) ([place])     No family history on file.  Social History:  has no history on file for tobacco use, alcohol use, and drug use.  Allergies:  Allergies  Allergen Reactions   Doxycycline     Severe arthralgia   Sulfa Antibiotics     hallucinations   Sulfamethoxazole     hallucinations    Glycopyrrolate-Formoterol Palpitations    Patient reports immediate palpitations after use    Medications: I have reviewed the patient's current medications.  Results for orders placed or performed during the hospital encounter of 04/16/20 (from the past 48 hour(s))  CBC     Status: Abnormal   Collection Time: 04/16/20  1:48 PM  Result Value Ref Range   WBC 7.5 4.0 - 10.5 K/uL   RBC 3.61 (L) 4.22 - 5.81 MIL/uL   Hemoglobin 9.9 (L) 13.0 - 17.0 g/dL   HCT 31.7 (L) 39 - 52 %   MCV 87.8 80.0 - 100.0 fL   MCH 27.4 26.0 - 34.0 pg   MCHC 31.2 30.0 - 36.0 g/dL   RDW 12.7 11.5 - 15.5 %   Platelets 175 150 - 400 K/uL   nRBC 0.0 0.0 - 0.2 %    Comment: Performed at [facility], [address] [place], [place] [address]  Comprehensive metabolic panel      Status: Abnormal   Collection Time: 04/16/20  1:48 PM  Result Value Ref Range   Sodium 134 (L) 135 - 145 mmol/L   Potassium 2.2 (LL) 3.5 - 5.1 mmol/L    Comment: HEMOLYSIS AT THIS LEVEL MAY AFFECT RESULT CRITICAL RESULT CALLED TO, READ BACK BY AND VERIFIED WITH KATIE FERGUSSON AT 1427 04/16/20 DAS    Chloride 79 (L) 98 - 111 mmol/L   CO2 40 (H) 22 - 32 mmol/L   Glucose, Bld 126 (H) 70 - 99 mg/dL    Comment: Glucose reference range applies only to samples taken after fasting for at least 8 hours.   BUN 7 6 - 20 mg/dL   Creatinine, Ser 0.64 0.61 - 1.24 mg/dL   Calcium 5.4 (LL) 8.9 - 10.3 mg/dL    Comment: CRITICAL RESULT CALLED TO, READ BACK BY AND VERIFIED WITH KATIE FERGUSSON AT 1427 04/16/20 DAS    Total Protein 5.1 (L) 6.5 - 8.1 g/dL   Albumin 2.1 (L) 3.5 - 5.0 g/dL   AST 33 15 - 41 U/L   ALT 18 0 - 44 U/L   Alkaline Phosphatase 116 38 - 126 U/L   Total Bilirubin 0.7 0.3 - 1.2 mg/dL   GFR, Estimated >60 >60 mL/min    Comment: (  NOTE) Calculated using the CKD-EPI Creatinine Equation (2021)    Anion gap 15 5 - 15    Comment: Performed at [facility], [address] [place], [place] [address]  Magnesium     Status: Abnormal   Collection Time: 04/16/20  1:48 PM  Result Value Ref Range   Magnesium 0.6 (LL) 1.7 - 2.4 mg/dL    Comment: CRITICAL RESULT CALLED TO, READ BACK BY AND VERIFIED WITH CANDICE KIO,RN 1551 04/16/2020 DB Performed at [facility], [address], [place], [place] [address]   Phosphorus     Status: None   Collection Time: 04/16/20  1:48 PM  Result Value Ref Range   Phosphorus 2.8 2.5 - 4.6 mg/dL    Comment: Performed at [facility], [address] [place], [place] [address]  Brain natriuretic peptide     Status: None   Collection Time: 04/16/20  1:48 PM  Result Value Ref Range   B Natriuretic Peptide 50.3 0.0 - 100.0 pg/mL    Comment: Performed at [facility], [address] [place], [place] [address]  Resp  Panel by RT-PCR (Flu A&B, Covid) Nasopharyngeal Swab     Status: None   Collection Time: 04/16/20  2:41 PM   Specimen: Nasopharyngeal Swab; Nasopharyngeal(NP) swabs in vial transport medium  Result Value Ref Range   SARS Coronavirus 2 by RT PCR NEGATIVE NEGATIVE    Comment: (NOTE) SARS-CoV-2 target nucleic acids are NOT DETECTED.  The SARS-CoV-2 RNA is generally detectable in upper respiratory specimens during the acute phase of infection. The lowest concentration of SARS-CoV-2 viral copies this assay can detect is 138 copies/mL. A negative result does not preclude SARS-Cov-2 infection and should not be used as the sole basis for treatment or other patient management decisions. A negative result may occur with  improper specimen collection/handling, submission of specimen other than nasopharyngeal swab, presence of viral mutation(s) within the areas targeted by this assay, and inadequate number of viral copies(<138 copies/mL). A negative result must be combined with clinical observations, patient history, and epidemiological information. The expected result is Negative.  Fact Sheet for Patients:  EntrepreneurPulse.com.au  Fact Sheet for Healthcare Providers:  IncredibleEmployment.be  This test is no t yet approved or cleared by the [place] FDA and  has been authorized for detection and/or diagnosis of SARS-CoV-2 by FDA under an Emergency Use Authorization (EUA). This EUA will remain  in effect (meaning this test can be used) for the duration of the COVID-19 declaration under Section 564(b)(1) of the Act, 21 U.S.C.section 360bbb-3(b)(1), unless the authorization is terminated  or revoked sooner.       Influenza A by PCR NEGATIVE NEGATIVE   Influenza B by PCR NEGATIVE NEGATIVE    Comment: (NOTE) The Xpert Xpress SARS-CoV-2/FLU/RSV plus assay is intended as an aid in the diagnosis of influenza from Nasopharyngeal swab specimens  and should not be used as a sole basis for treatment. Nasal washings and aspirates are unacceptable for Xpert Xpress SARS-CoV-2/FLU/RSV testing.  Fact Sheet for Patients: EntrepreneurPulse.com.au  Fact Sheet for Healthcare Providers: IncredibleEmployment.be  This test is not yet approved or cleared by the [place] FDA and has been authorized for detection and/or diagnosis of SARS-CoV-2 by FDA under an Emergency Use Authorization (EUA). This EUA will remain in effect (meaning this test can be used) for the duration of the COVID-19 declaration under Section 564(b)(1) of the Act, 21 U.S.C. section 360bbb-3(b)(1), unless the authorization is terminated or revoked.  Performed at  [facility], [address] [place], [place] [address]   Protime-INR     Status: None   Collection Time: 04/16/20  5:02 PM  Result Value Ref Range   Prothrombin Time 12.7 11.4 - 15.2 seconds   INR 1.0 0.8 - 1.2    Comment: (NOTE) INR goal varies based on device and disease states. Performed at [facility], [address] [place], [place] [address]   APTT     Status: None   Collection Time: 04/16/20  5:02 PM  Result Value Ref Range   aPTT 28 24 - 36 seconds    Comment: Performed at [facility], [address] [place], [place] [address]  Type and screen     Status: None (Preliminary result)   Collection Time: 04/16/20  6:21 PM  Result Value Ref Range   ABO/RH(D) PENDING    Antibody Screen PENDING    Sample Expiration      04/19/2020,2359 Performed at [facility], [address], [place], [place] [address]     DG Chest 1 View  Result Date: 04/16/2020 CLINICAL DATA:  Right hip pain after a fall today. EXAM: CHEST  1 VIEW COMPARISON:  None. FINDINGS: A Port-A-Cath terminates over the right atrium. The cardiac silhouette is normal in size. There is mild prominence of the central pulmonary arteries. No airspace  consolidation, edema, pleural effusion, or pneumothorax is identified. Surgical clips are noted in the right upper abdomen. No acute osseous abnormality is identified. IMPRESSION: No active disease. Electronically Signed   By: Logan Bores M.D.   On: 04/16/2020 15:05   CT Head Wo Contrast  Result Date: 04/16/2020 CLINICAL DATA:  Head trauma, minor, normal mental status. Additional history provided: Fall. EXAM: CT HEAD WITHOUT CONTRAST TECHNIQUE: Contiguous axial images were obtained from the base of the skull through the vertex without intravenous contrast. COMPARISON:  No pertinent prior exams available for comparison. FINDINGS: Brain: The examination is mildly motion degraded. There is no acute intracranial hemorrhage. No demarcated cortical infarct. No extra-axial fluid collection. No evidence of intracranial mass. No midline shift. Vascular: No hyperdense vessel. Skull: Normal. Negative for fracture or focal lesion. Sinuses/Orbits: Visualized orbits show no acute finding. Tiny bilateral maxillary sinus mucous retention cyst. Trace right mastoid effusion IMPRESSION: Motion degraded examination. No evidence of acute intracranial abnormality. Trace right mastoid effusion. Electronically Signed   By: Kellie Simmering DO   On: 04/16/2020 14:21   DG HIP UNILAT W OR W/O PELVIS 2-3 VIEWS RIGHT  Result Date: 04/16/2020 CLINICAL DATA:  [place] on right hip. EXAM: DG HIP (WITH OR WITHOUT PELVIS) 2-3V RIGHT COMPARISON:  None. FINDINGS: Bones are diffusely demineralized. Comminuted right intertrochanteric hip fracture noted with varus angulation. Pubic rami unremarkable. IMPRESSION: Comminuted right intertrochanteric hip fracture with varus angulation. Electronically Signed   By: Misty Stanley M.D.   On: 04/16/2020 15:05    Review of Systems Blood pressure 108/80, pulse (!) 113, temperature 98.1 F (36.7 C), temperature source Oral, resp. rate 20, height 5\' 9"  (1.753 m), weight 38.6 kg, SpO2 92 %. Physical  Exam Patient is a cachectic ill-appearing male older than stated age with the right leg shortened and externally rotated.  He has a trace dorsalis pedis pulses skin is intact. Radiographs show displaced varus angulation of a proximal intertrochanteric hip fracture. Assessment/Plan: Patient is suffering from right intertrochanteric hip fracture which would normally be amenable to operative fixation with short IM rod and proximal and distal screws.  His pulmonary  status may prohibit [place] from surgery and I discussed this with him.  Surgery would be high risk and would need minimal sedation if he did have a spinal.  He says he also suffers from panic attacks and will need to be sedated as well.  Will await pulmonary eval in their opinion.  Hessie Knows 04/16/2020, 4:43 PM

## 2020-04-16 NOTE — Progress Notes (Addendum)
[place] [facility] [place] ([place] patient RN note:  Gordon Rubio is under hospice services with [facility], recently being treated for chronic respiratory failure/COPD. He recently moved to [place]. He is a DNR. His sister, Gordon Rubio is his HCPOA and contact person for decision making and her phone # is [phone]. Per Dr. Gilford Rile with [facility], this is a related hospital admission.  Visited with patient and sister in the room.  Patient states that he fell when was walking and tripped. He hit his head, and injured his right hip. He developed right hip pain, which is constant, sharp, severe, nonradiating.CT head is negative for acute intracranial abnormalities. Chest x-ray negative. X-ray of right hip/pelvis showed comminuted right intertrochanteric fracture.  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 Orthopedic surgery, Dr. Benay Pike is consulted. Given patient history of end-stage of COPD, severe protein caloric malnutrition, severe electrolyte disturbance, patient is very poor candidate for surgery currently. I consulted pulmonologist, Dr. Lanney Gins for further evaluation uppercase of pulmonary function.  - will admit to Med-surg bed - Pain control: morphine prn and percocet - When necessary Zofran for nausea - Robaxin for muscle spasm - type and cross - INR/PTT - PT/OT when able to (not ordered now)  Fall: CT head negative for acute intracranial abnormalities. -PT/OT when able to  Peripheral T-cell lymphoma [facility]): Patient had  immunotherapy and chemotherapy in the past, currently not receiving any treatment. Patient is in hospice care. -Follow-up with CBC  End stage COPD ([place]) -Continue bronchodilators -Follow-up pulmonologist recommendations  Depression with anxiety -Continue home medications  Electrolytes disturbance: Including hypokalemia with a potassium 2.2, hypocalcemia with calcium of 5.4, hypomagnesemia with magnesium 0.6 -Repleted potassium with IV 10 mEq of potassium chloride x 2 and oral potassium chloride 40 mEq x 3 -Magnesium sulfate 2 g x 2 -Calcium gluconate 3, total -Check phosphorus level -hold lasix  Crohn disease (HCC) -Entocort  Chronic pain syndrome: -continue MS Contin  Protein-calorie malnutrition, severe -start Ensure  DVT ppx: SCD Code Status: DNR (per his sister. Pt has DNR paper from facility) Family Communication: Yes, patient's sister   at bed side Disposition Plan:  Anticipate discharge back to previous SNF environment Consults called:  Dr. Rudene Christians of ortho and Dr. Keenan Bachelor of pulmonology Admission status: Med-surg bed as inpt     Discharge planning: Waiting on ortho and pulmonology consults.  Medication list and transfer summary placed on [facility] chart.  Zandra Abts, RN [facility] Liaison [phone]

## 2020-04-16 NOTE — ED Notes (Signed)
Patient transported to CT 

## 2020-04-16 NOTE — Consult Note (Signed)
Pulmonary Medicine          Date: 04/17/2020,   MRN# [id number] Gordon Rubio February 04, 1966     AdmissionWeight: 38.6 kg                 CurrentWeight: 38.6 kg Referring physician: Dr. Blaine Hamper     CHIEF COMPLAINT:   Advanced centrilobular emphysema with requirement pulmonary for presurgical evaluation   HISTORY OF PRESENT ILLNESS   Pleasant 54 year old male with centrilobular and panlobular emphysema chronic hypoxemia with 3 L O2 requirement at baseline as well as alpha-1 antitrypsin deficiency, history of Crohn's disease, chemotherapy-induced neuropathy, rheumatoid arthritis, osteoporosis, nephrolithiasis and T-cell lymphoma, depression, chronic pain syndrome, protein calorie malnutrition of moderate severity, history of lifelong smoking, chronic diarrhea, is currently with hospice and presents to the hospital after mechanical fall status post right hip fracture.  In the ER he was found to have relatively unimpressive CBC and BMP chest x-ray was done with emphysematous appearing lungs bilaterally however without pulmonary edema pneumothorax or consolidated infiltrate, COVID-19 was negative and vitals relatively stable on home O2 requirement.  Patient was evaluated by orthopedic surgery with discovery of high intratrochanteric hip fracture with likely requiring surgical intervention for repair.  Pulmonary consultation was placed for presurgical evaluation due to complex and advanced pulmonary comorbid history.  -After discussion with patient regarding risk factor and specifically his high risk of post operative respiratory failure patient explains that he has had numerous surgeries in the past and had no issues and this time he is in severe pain and immobilized so he insists that operation be done as he would not wish to live with such severe pain nor inability to ambulate.   Spirometric data 04/17/20  Media Information   Document Information  Photos    04/17/2020 11:58    Attached To:  Hospital Encounter on 04/16/20  Source Information  Ottie Glazier, MD  [facility] (1a)     PAST MEDICAL HISTORY   Past Medical History:  Diagnosis Date  . Cancer ([place])   . Chronic pain syndrome   . COPD (chronic obstructive pulmonary disease) ([place])   . Crohn's disease ([place])   . 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 BOWEL RESECTION       FAMILY HISTORY   Family History  Problem Relation Age of Onset  . COPD Mother   . Stroke Father      SOCIAL HISTORY   Social History   Tobacco Use  . Smoking status: Former Research scientist (life sciences)  . Smokeless tobacco: Never Used  Substance Use Topics  . Alcohol use: Not Currently  . Drug use: Never     MEDICATIONS    Home Medication:    Current Medication:  Current Facility-Administered Medications:  .  0.9 % NaCl with KCl 40 mEq / L  infusion, , Intravenous, Continuous, Jennye Boroughs, MD, Last Rate: 75 mL/hr at 04/17/20 0911, New Bag at 04/17/20 0911 .  acetaminophen (TYLENOL) tablet 650 mg, 650 mg, Oral, Q6H PRN, Ivor Costa, MD .  albuterol (VENTOLIN HFA) 108 (90 Base) MCG/ACT inhaler 2 puff, 2 puff, Inhalation, Q4H PRN, Ivor Costa, MD, 2 puff at 04/17/20 1032 .  dextromethorphan-guaiFENesin (MUCINEX DM) 30-600 MG per 12 hr tablet 1 tablet, 1 tablet, Oral, BID PRN, Ivor Costa, MD .  feeding supplement (ENSURE ENLIVE / ENSURE PLUS) liquid 237 mL, 237 mL, Oral, BID BM, Ivor Costa, MD .  HYDROmorphone (DILAUDID) injection 1 mg, 1 mg, Intravenous, Q3H PRN, Jennye Boroughs, MD .  methocarbamol (ROBAXIN) tablet 500 mg, 500 mg, Oral, Q8H PRN, Ivor Costa, MD, 500 mg at 04/17/20 0316 .  morphine 2 MG/ML injection 1 mg, 1 mg, Intravenous, Q3H PRN, Ivor Costa, MD, 1 mg at 04/17/20 0908 .  ondansetron (ZOFRAN) injection 4 mg, 4 mg, Intravenous, Q8H PRN, Ivor Costa, MD .  oxyCODONE-acetaminophen (PERCOCET/ROXICET) 5-325 MG per tablet 1 tablet, 1  tablet, Oral, Q6H PRN, Ivor Costa, MD, 1 tablet at 04/17/20 0316 .  senna-docusate (Senokot-S) tablet 1 tablet, 1 tablet, Oral, QHS PRN, Ivor Costa, MD    ALLERGIES   Doxycycline, Sulfa antibiotics, Sulfamethoxazole, and Glycopyrrolate-formoterol     REVIEW OF SYSTEMS    Review of Systems:  Gen:  Denies  fever, sweats, chills weigh loss  HEENT: Denies blurred vision, double vision, ear pain, eye pain, hearing loss, nose bleeds, sore throat Cardiac:  No dizziness, chest pain or heaviness, chest tightness,edema Resp:   Denies cough or sputum porduction, shortness of breath,wheezing, hemoptysis,  Gi: Denies swallowing difficulty, stomach pain, nausea or vomiting, diarrhea, constipation, bowel incontinence Gu:  Denies bladder incontinence, burning urine Ext:   Denies Joint pain, stiffness or swelling Skin: Denies  skin rash, easy bruising or bleeding or hives Endoc:  Denies polyuria, polydipsia , polyphagia or weight change Psych:   Denies depression, insomnia or hallucinations   Other: reports pain severe and improved with IV daulaudid, severe hip pain post fracture   VS: BP 102/76 (BP Location: Right Arm)   Pulse (!) 103   Temp 98.6 F (37 C)   Resp 18   Ht 5\' 9"  (1.753 m)   Wt 38.6 kg   SpO2 96%   BMI 12.55 kg/m      PHYSICAL EXAM    GENERAL:NAD, no fevers, chills, no weakness no fatigue HEAD: Normocephalic, atraumatic.  EYES: Pupils equal, round, reactive to light. Extraocular muscles intact. No scleral icterus.  MOUTH: Moist mucosal membrane. Dentition intact. No abscess noted.  EAR, NOSE, THROAT: Clear without exudates. No external lesions.  NECK: Supple. No thyromegaly. No nodules. No JVD.  PULMONARY: decreased bs bilaterally no wheezing no rhonchi CARDIOVASCULAR: S1 and S2. Regular rate and rhythm. No murmurs, rubs, or gallops. No edema. Pedal pulses 2+ bilaterally.  GASTROINTESTINAL: Soft, nontender, nondistended. No masses. Positive bowel sounds. No  hepatosplenomegaly.  MUSCULOSKELETAL:broken r hip with pain and tenderness NEUROLOGIC: Cranial nerves II through XII are intact. No gross focal neurological deficits. Sensation intact. Reflexes intact.  SKIN: No ulceration, lesions, rashes, or cyanosis. Skin warm and dry. Turgor intact.  PSYCHIATRIC: Mood, affect within normal limits. The patient is awake, alert and oriented x 3. Insight, judgment intact.       IMAGING    DG Chest 1 View  Result Date: 04/16/2020 CLINICAL DATA:  Right hip pain after a fall today. EXAM: CHEST  1 VIEW COMPARISON:  None. FINDINGS: A Port-A-Cath terminates over the right atrium. The cardiac silhouette is normal in size. There is mild prominence of the central pulmonary arteries. No airspace consolidation, edema, pleural effusion, or pneumothorax is identified. Surgical clips are noted in the right upper abdomen. No acute osseous abnormality is identified. IMPRESSION: No active disease. Electronically Signed   By: Logan Bores M.D.   On: 04/16/2020 15:05   CT Head Wo Contrast  Result Date: 04/16/2020 CLINICAL DATA:  Head trauma, minor, normal mental status. Additional history provided: Fall. EXAM: CT HEAD WITHOUT CONTRAST TECHNIQUE: Contiguous axial  images were obtained from the base of the skull through the vertex without intravenous contrast. COMPARISON:  No pertinent prior exams available for comparison. FINDINGS: Brain: The examination is mildly motion degraded. There is no acute intracranial hemorrhage. No demarcated cortical infarct. No extra-axial fluid collection. No evidence of intracranial mass. No midline shift. Vascular: No hyperdense vessel. Skull: Normal. Negative for fracture or focal lesion. Sinuses/Orbits: Visualized orbits show no acute finding. Tiny bilateral maxillary sinus mucous retention cyst. Trace right mastoid effusion IMPRESSION: Motion degraded examination. No evidence of acute intracranial abnormality. Trace right mastoid effusion.  Electronically Signed   By: Kellie Simmering DO   On: 04/16/2020 14:21   DG HIP UNILAT W OR W/O PELVIS 2-3 VIEWS RIGHT  Result Date: 04/16/2020 CLINICAL DATA:  [place] on right hip. EXAM: DG HIP (WITH OR WITHOUT PELVIS) 2-3V RIGHT COMPARISON:  None. FINDINGS: Bones are diffusely demineralized. Comminuted right intertrochanteric hip fracture noted with varus angulation. Pubic rami unremarkable. IMPRESSION: Comminuted right intertrochanteric hip fracture with varus angulation. Electronically Signed   By: Misty Stanley M.D.   On: 04/16/2020 15:05      ASSESSMENT/PLAN    #1Advanced panlobular emphysema with chronic hypoxemia and alpha-1 antitrypsin deficiency Pulmonary status is currently close to baseline -Recommend incentive spirometry and chest physiotherapy throughout hospitalization -Typical COPD care path without exacerbation    #2Presurgical pulmonary evaluation -Comminuted right intertrochanteric hip fracture with varus Angulation. ARISCAT ([place]) preoperative pulmonary risk index in adults-42.1%-high risk for pulmonary intra and post operative complication [place] respiratory failure index:  High risk 10.1% risk of post operative respiratory failure [place] Safe-  Post operative Respiratory Failure Risk Calculator: 27.9% -High risk Postoperative respiratory failure (PRF)  Baseline advanced panlobular emphysema Severe protein calorie malnutrition Pulmonary hypertension per TTE 03/16/2018 -will order repeat study prior to surgery Bronchoscopy 09/19/2017 - phlegm throughout airways -Spirometry 04/17/20 - FEV1 - 11% predicted >>>>consistent with known history of advanced panlobular emphysema and COPD GOLD stage 4D - consider pulmonary transplant evaluation  -Overall patient with high pre-test probability of post operative respiratory complications - if surgery is absolutely required patient may require prolonged intubation with difficulty weaning/liberating from mechanical ventilation and may  require prolonged hospitalization.      #3Acute comminuted right hip fracture   -Orthopedic surgery on case-appreciate input   -Pulmonary presurgical evaluation & medical optimization in process    Thank you for allowing me to participate in the care of this patient.   Patient/Family are satisfied with care plan and all questions have been answered.   This document was prepared using Dragon voice recognition software and may include unintentional dictation errors.     Ottie Glazier, M.D.  Division of [place]

## 2020-04-16 NOTE — ED Notes (Signed)
Date and time results received: 04/16/20 3:51 PM   Test: Magnesium Critical Value: 0.6  Name of Provider Notified: Blaine Hamper, MD   Orders Received? Or Actions Taken?: Actions Taken: Provider aware.

## 2020-04-17 DIAGNOSIS — S72001D Fracture of unspecified part of neck of right femur, subsequent encounter for closed fracture with routine healing: Secondary | ICD-10-CM

## 2020-04-17 DIAGNOSIS — E43 Unspecified severe protein-calorie malnutrition: Secondary | ICD-10-CM

## 2020-04-17 LAB — CBC
HCT: 29.1 % — ABNORMAL LOW (ref 39.0–52.0)
Hemoglobin: 9 g/dL — ABNORMAL LOW (ref 13.0–17.0)
MCH: 26.9 pg (ref 26.0–34.0)
MCHC: 30.9 g/dL (ref 30.0–36.0)
MCV: 87.1 fL (ref 80.0–100.0)
Platelets: 167 10*3/uL (ref 150–400)
RBC: 3.34 MIL/uL — ABNORMAL LOW (ref 4.22–5.81)
RDW: 13 % (ref 11.5–15.5)
WBC: 9.6 10*3/uL (ref 4.0–10.5)
nRBC: 0 % (ref 0.0–0.2)

## 2020-04-17 LAB — BASIC METABOLIC PANEL
Anion gap: 12 (ref 5–15)
BUN: 7 mg/dL (ref 6–20)
CO2: 41 mmol/L — ABNORMAL HIGH (ref 22–32)
Calcium: 7.1 mg/dL — ABNORMAL LOW (ref 8.9–10.3)
Chloride: 77 mmol/L — ABNORMAL LOW (ref 98–111)
Creatinine, Ser: 0.64 mg/dL (ref 0.61–1.24)
GFR, Estimated: >60 mL/min (ref >60)
Glucose, Bld: 112 mg/dL — ABNORMAL HIGH (ref 70–99)
Potassium: 3.3 mmol/L — ABNORMAL LOW (ref 3.5–5.1)
Sodium: 130 mmol/L — ABNORMAL LOW (ref 135–145)

## 2020-04-17 LAB — PHOSPHORUS: Phosphorus: 3.6 mg/dL (ref 2.5–4.6)

## 2020-04-17 LAB — HIV ANTIBODY (ROUTINE TESTING W REFLEX): HIV Screen 4th Generation wRfx: NONREACTIVE

## 2020-04-17 LAB — MAGNESIUM: Magnesium: 2 mg/dL (ref 1.7–2.4)

## 2020-04-17 MED ORDER — OLANZAPINE 5 MG PO TABS
10.0000 mg | ORAL_TABLET | Freq: Every day | ORAL | Status: DC
  Administered 2020-04-17 – 2020-04-21 (×4): 10 mg via ORAL
  Filled 2020-04-17 (×4): qty 2

## 2020-04-17 MED ORDER — LORAZEPAM 0.5 MG PO TABS
0.5000 mg | ORAL_TABLET | Freq: Four times a day (QID) | ORAL | Status: DC
  Administered 2020-04-17 – 2020-04-19 (×5): 0.5 mg via ORAL
  Filled 2020-04-17 (×7): qty 1

## 2020-04-17 MED ORDER — PANTOPRAZOLE SODIUM 40 MG PO TBEC
40.0000 mg | DELAYED_RELEASE_TABLET | Freq: Every day | ORAL | Status: DC
  Administered 2020-04-17 – 2020-04-22 (×5): 40 mg via ORAL
  Filled 2020-04-17 (×5): qty 1

## 2020-04-17 MED ORDER — HYDROMORPHONE HCL 1 MG/ML IJ SOLN
1.0000 mg | INTRAMUSCULAR | Status: DC | PRN
  Administered 2020-04-17 – 2020-04-19 (×13): 1 mg via INTRAVENOUS
  Filled 2020-04-17 (×14): qty 1

## 2020-04-17 MED ORDER — CEFAZOLIN SODIUM-DEXTROSE 1-4 GM/50ML-% IV SOLN
1.0000 g | Freq: Once | INTRAVENOUS | Status: AC
  Administered 2020-04-18: 1 g via INTRAVENOUS
  Filled 2020-04-17: qty 50

## 2020-04-17 MED ORDER — BUDESONIDE 3 MG PO CPEP
9.0000 mg | ORAL_CAPSULE | Freq: Every morning | ORAL | Status: DC
  Administered 2020-04-19 – 2020-04-21 (×2): 9 mg via ORAL
  Filled 2020-04-17 (×5): qty 3

## 2020-04-17 MED ORDER — LORAZEPAM 0.5 MG PO TABS
0.5000 mg | ORAL_TABLET | Freq: Three times a day (TID) | ORAL | Status: DC
  Administered 2020-04-17: 0.5 mg via ORAL
  Filled 2020-04-17: qty 1

## 2020-04-17 MED ORDER — OLANZAPINE 5 MG PO TABS
5.0000 mg | ORAL_TABLET | Freq: Every day | ORAL | Status: DC
  Administered 2020-04-17 – 2020-04-20 (×3): 5 mg via ORAL
  Filled 2020-04-17 (×5): qty 1

## 2020-04-17 MED ORDER — MIRTAZAPINE 15 MG PO TABS
45.0000 mg | ORAL_TABLET | Freq: Every day | ORAL | Status: DC
  Administered 2020-04-17 – 2020-04-21 (×4): 45 mg via ORAL
  Filled 2020-04-17 (×4): qty 3

## 2020-04-17 MED ORDER — SERTRALINE HCL 50 MG PO TABS
100.0000 mg | ORAL_TABLET | Freq: Every day | ORAL | Status: DC
  Administered 2020-04-17 – 2020-04-21 (×4): 100 mg via ORAL
  Filled 2020-04-17 (×5): qty 2

## 2020-04-17 MED ORDER — MORPHINE SULFATE ER 30 MG PO TBCR
90.0000 mg | EXTENDED_RELEASE_TABLET | Freq: Two times a day (BID) | ORAL | Status: DC
  Administered 2020-04-18 – 2020-04-19 (×2): 90 mg via ORAL
  Filled 2020-04-17 (×2): qty 3

## 2020-04-17 MED ORDER — CALCIUM CITRATE 950 (200 CA) MG PO TABS
400.0000 mg | ORAL_TABLET | Freq: Every day | ORAL | Status: DC
  Administered 2020-04-19 – 2020-04-22 (×3): 400 mg via ORAL
  Filled 2020-04-17 (×6): qty 2

## 2020-04-17 MED ORDER — POTASSIUM CHLORIDE IN NACL 40-0.9 MEQ/L-% IV SOLN
INTRAVENOUS | Status: DC
  Filled 2020-04-17 (×10): qty 1000

## 2020-04-17 NOTE — Progress Notes (Signed)
Initial Nutrition Assessment  RD working remotely.  DOCUMENTATION CODES:   Underweight  INTERVENTION:  - diet advancement as medically feasible. - continue Ensure Enlive BID, each supplement provides 350 kcal and 20 grams of protein. - complete NFPE at follow-up.    NUTRITION DIAGNOSIS:   Inadequate oral intake related to inability to eat as evidenced by NPO status.  GOAL:   Patient will meet greater than or equal to 90% of their needs  MONITOR:   Diet advancement, Labs, Weight trends  REASON FOR ASSESSMENT:   Consult Assessment of nutrition requirement/status  ASSESSMENT:   54 y.o. male with medical history of end stage COPD due to alpha 1 antitrypsin deficiency on 3L O2 and on hospice, depression, anxiety, peripheral T-cell lymphoma, Crohn's disease with chronic mild diarrhea, and chronic pain. He presented to the ED after a fall when he was walking and tripped on steps. He hit his head but had no loss of consciousness and after the fall had associated R hip pain which was constant, sharp, severe, and non-radiating.  Diet changed from Regular (ordered at 1650) to NPO yesterday at 1653 and patient has remained NPO since that time.   He has not been seen by a [place] RD at any time in the past. Weight yesterday was 85 lb and appears to possibly be a stated weight.   The only other weight documented in the chart is [facility]20 at which time he weighed 106 lb. This would indicate 21 lb weight loss (19.8% body weight) in the past 2 months; significant for time frame.  If weight recordings are accurate, highly suspect severe malnutrition, but unable to confirm without completing NFPE.   MST score of 1.   Patient is pending Pulmonary evaluation to determine if surgery is feasible pertaining to R hip fx.     Labs reviewed; Na: 130 mmol/l, K: 3.3 mmol/l, Cl: 77 mmol/l, Ca: 7.1 mg/dl. Medications reviewed; 2 g IV Mg sulfate x2 runs 12/3. IVF; NS-40 mEq KCl @ 75  ml/hr.    NUTRITION - FOCUSED PHYSICAL EXAM:  unable to complete at this time.   Diet Order:   Diet Order            Diet NPO time specified Except for: Sips with Meds  Diet effective midnight                 EDUCATION NEEDS:   Not appropriate for education at this time  Skin:  Skin Assessment: Reviewed RN Assessment  Last BM:  PTA/unknown  Height:   Ht Readings from Last 1 Encounters:  04/16/20 5\' 9"  (1.753 m)    Weight:   Wt Readings from Last 1 Encounters:  04/16/20 38.6 kg     Estimated Nutritional Needs:  Kcal:  1820-2035 kcal (25-28 kcal/kg IBW) Protein:  85-100 grams Fluid:  >/= 1.8 L/day      Jarome Matin, MS, RD, LDN, CNSC Inpatient Clinical Dietitian RD pager # available in AMION  After hours/weekend pager # available in [facility]

## 2020-04-17 NOTE — Progress Notes (Signed)
Progress Note    Gordon Rubio  JJO:[id number] DOB: [birth year]  DOA: 04/16/2020 PCP: Einar Crow, MD      Brief Narrative:    Medical records reviewed and are as summarized below:  Gordon Rubio is a 54 y.o. 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  He presented to the hospital after a fall and complaint of right hip.  He was found to have right hip fracture.    Assessment/Plan:   Principal Problem:   Closed right hip fracture 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  (Underweight)   Right hip fracture, s/p fall: Analgesics as needed for pain.  Patient is a poor surgical candidate.  However, he is willing to proceed with surgery despite the risks involved.  He said he does not want to revoke his DNR even though he is going to surgery.  Follow-up with orthopedic surgeon.  Pulmonologist has been consulted for optimization prior to surgery.  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  He was on hospice prior to admission.  End-stage COPD with chronic hypoxemic respiratory failure.  Continue bronchodilators.  Continue with aspirin 80 oxygen via nasal cannula.  Hypokalemia: Replete potassium and monitor levels.  Advanced Crohn's disease: Continue Entocort  Osteoporosis multiple wedge compression fractures of the mid thoracic spine and upper lumbar spine.  Continue  analgesics.  Depression and anxiety: Continue psychotropics     Diet Order            Diet NPO time specified Except for: Sips with Meds  Diet effective midnight                    Consultants:  [place] surgeon  Procedures:  Plan for right hip surgery    Medications:   . [START ON 04/18/2020] budesonide  9 mg Oral q AM  . calcium citrate  400 mg of elemental calcium Oral Daily  . feeding supplement  237 mL Oral BID BM  . LORazepam  0.5 mg Oral TID  . mirtazapine  45 mg Oral QHS  . morphine  90 mg Oral Q12H  . OLANZapine  10 mg Oral QHS  . OLANZapine  5 mg Oral Daily  . pantoprazole  40 mg Oral Daily  . sertraline  100 mg Oral Daily   Continuous Infusions: . 0.9 % NaCl with KCl 40 mEq / L 75 mL/hr at 04/17/20 0911     Anti-infectives (From admission, onward)   None             Family Communication/Anticipated D/C date and plan/Code Status   DVT prophylaxis: SCDs Start: 04/16/20 1653     Code Status: DNR  Family Communication: Discussed with his wife at the bedside Disposition Plan:    Status is: Inpatient  Remains inpatient appropriate because:Unsafe d/c plan and Inpatient level of care appropriate due to severity of illness   Dispo: The  patient is from: Home              Anticipated d/c is to: SNF              Anticipated d/c date is: > 3 days              Patient currently is not medically stable to d/c.           Subjective:   C/o severe pain in the right hip.  IV morphine does not help with.  He said he has a high tolerance for pain  Objective:    Vitals:   04/16/20 2316 04/17/20 0355 04/17/20 0748 04/17/20 1142  BP: 119/88 99/72 102/76 103/78  Pulse: (!) 104 100 (!) 103 (!) 106  Resp: 18 18 18 20   Temp: 98.3 F (36.8 C) 98 F (36.7 C) 98.6 F (37 C) 98.6 F (37 C)  TempSrc: Oral Oral    SpO2: 96% 96% 96% 95%  Weight:      Height:       No data found.   Intake/Output Summary (Last  24 hours) at 04/17/2020 1149 Last data filed at 04/17/2020 0400 Gross per 24 hour  Intake 1516.16 ml  Output 625 ml  Net 891.16 ml   Filed Weights   04/16/20 1344  Weight: 38.6 kg    Exam:  GEN: NAD SKIN: Warm and dry EYES: EOMI ENT: MMM CV: RRR PULM: CTA B ABD: soft, ND, NT, +BS CNS: AAO x 3, non focal EXT: No edema or tenderness   Data Reviewed:   I have personally reviewed following labs and imaging studies:  Labs: Labs show the following:   Basic Metabolic Panel: Recent Labs  Lab 04/16/20 1348 04/17/20 0337  NA 134* 130*  K 2.2* 3.3*  CL 79* 77*  CO2 40* 41*  GLUCOSE 126* 112*  BUN 7 7  CREATININE 0.64 0.64  CALCIUM 5.4* 7.1*  MG 0.6* 2.0  PHOS 2.8 3.6   GFR Estimated Creatinine Clearance: 57 mL/min (by C-G formula based on SCr of 0.64 mg/dL). Liver Function Tests: Recent Labs  Lab 04/16/20 1348  AST 33  ALT 18  ALKPHOS 116  BILITOT 0.7  PROT 5.1*  ALBUMIN 2.1*   No results for input(s): LIPASE, AMYLASE in the last 168 hours. No results for input(s): AMMONIA in the last 168 hours. Coagulation profile Recent Labs  Lab 04/16/20 1702  INR 1.0    CBC: Recent Labs  Lab 04/16/20 1348 04/17/20 0337  WBC 7.5 9.6  HGB 9.9* 9.0*  HCT 31.7* 29.1*  MCV 87.8 87.1  PLT 175 167   Cardiac Enzymes: No results for input(s): CKTOTAL, CKMB, CKMBINDEX, TROPONINI in the last 168 hours. BNP (last 3 results) No results for input(s): PROBNP in the last 8760 hours. CBG: No results for input(s): GLUCAP in the last 168 hours. D-Dimer: No results for input(s): DDIMER in the last 72 hours. Hgb A1c: No results for input(s): HGBA1C in the last 72 hours. Lipid Profile: No results for input(s): CHOL, HDL, LDLCALC, TRIG, CHOLHDL, LDLDIRECT in the last 72 hours. Thyroid function studies: No results for input(s): TSH, T4TOTAL, T3FREE, THYROIDAB in the last 72 hours.  Invalid input(s): FREET3 Anemia work up: No results for input(s): VITAMINB12, FOLATE,  FERRITIN, TIBC, IRON, RETICCTPCT in the last 72 hours. Sepsis Labs: Recent Labs  Lab 04/16/20 1348 04/17/20 0337  WBC 7.5 9.6    Microbiology Recent Results (from the past 240 hour(s))  Resp Panel by RT-PCR (  Flu A&B, Covid) Nasopharyngeal Swab     Status: None   Collection Time: 04/16/20  2:41 PM   Specimen: Nasopharyngeal Swab; Nasopharyngeal(NP) swabs in vial transport medium  Result Value Ref Range Status   SARS Coronavirus 2 by RT PCR NEGATIVE NEGATIVE Final    Comment: (NOTE) SARS-CoV-2 target nucleic acids are NOT DETECTED.  The SARS-CoV-2 RNA is generally detectable in upper respiratory specimens during the acute phase of infection. The lowest concentration of SARS-CoV-2 viral copies this assay can detect is 138 copies/mL. A negative result does not preclude SARS-Cov-2 infection and should not be used as the sole basis for treatment or other patient management decisions. A negative result may occur with  improper specimen collection/handling, submission of specimen other than nasopharyngeal swab, presence of viral mutation(s) within the areas targeted by this assay, and inadequate number of viral copies(<138 copies/mL). A negative result must be combined with clinical observations, patient history, and epidemiological information. The expected result is Negative.  Fact Sheet for Patients:  EntrepreneurPulse.com.au  Fact Sheet for Healthcare Providers:  IncredibleEmployment.be  This test is no t yet approved or cleared by the [place] FDA and  has been authorized for detection and/or diagnosis of SARS-CoV-2 by FDA under an Emergency Use Authorization (EUA). This EUA will remain  in effect (meaning this test can be used) for the duration of the COVID-19 declaration under Section 564(b)(1) of the Act, 21 U.S.C.section 360bbb-3(b)(1), unless the authorization is terminated  or revoked sooner.       Influenza A by PCR NEGATIVE  NEGATIVE Final   Influenza B by PCR NEGATIVE NEGATIVE Final    Comment: (NOTE) The Xpert Xpress SARS-CoV-2/FLU/RSV plus assay is intended as an aid in the diagnosis of influenza from Nasopharyngeal swab specimens and should not be used as a sole basis for treatment. Nasal washings and aspirates are unacceptable for Xpert Xpress SARS-CoV-2/FLU/RSV testing.  Fact Sheet for Patients: EntrepreneurPulse.com.au  Fact Sheet for Healthcare Providers: IncredibleEmployment.be  This test is not yet approved or cleared by the [place] FDA and has been authorized for detection and/or diagnosis of SARS-CoV-2 by FDA under an Emergency Use Authorization (EUA). This EUA will remain in effect (meaning this test can be used) for the duration of the COVID-19 declaration under Section 564(b)(1) of the Act, 21 U.S.C. section 360bbb-3(b)(1), unless the authorization is terminated or revoked.  Performed at [facility], [address] [place], [place] [address]     Procedures and diagnostic studies:  DG Chest 1 View  Result Date: 04/16/2020 CLINICAL DATA:  Right hip pain after a fall today. EXAM: CHEST  1 VIEW COMPARISON:  None. FINDINGS: A Port-A-Cath terminates over the right atrium. The cardiac silhouette is normal in size. There is mild prominence of the central pulmonary arteries. No airspace consolidation, edema, pleural effusion, or pneumothorax is identified. Surgical clips are noted in the right upper abdomen. No acute osseous abnormality is identified. IMPRESSION: No active disease. Electronically Signed   By: Logan Bores M.D.   On: 04/16/2020 15:05   CT Head Wo Contrast  Result Date: 04/16/2020 CLINICAL DATA:  Head trauma, minor, normal mental status. Additional history provided: Fall. EXAM: CT HEAD WITHOUT CONTRAST TECHNIQUE: Contiguous axial images were obtained from the base of the skull through the vertex without intravenous contrast.  COMPARISON:  No pertinent prior exams available for comparison. FINDINGS: Brain: The examination is mildly motion degraded. There is no acute intracranial hemorrhage. No demarcated cortical infarct. No extra-axial fluid collection. No  evidence of intracranial mass. No midline shift. Vascular: No hyperdense vessel. Skull: Normal. Negative for fracture or focal lesion. Sinuses/Orbits: Visualized orbits show no acute finding. Tiny bilateral maxillary sinus mucous retention cyst. Trace right mastoid effusion IMPRESSION: Motion degraded examination. No evidence of acute intracranial abnormality. Trace right mastoid effusion. Electronically Signed   By: Kellie Simmering DO   On: 04/16/2020 14:21   DG HIP UNILAT W OR W/O PELVIS 2-3 VIEWS RIGHT  Result Date: 04/16/2020 CLINICAL DATA:  [place] on right hip. EXAM: DG HIP (WITH OR WITHOUT PELVIS) 2-3V RIGHT COMPARISON:  None. FINDINGS: Bones are diffusely demineralized. Comminuted right intertrochanteric hip fracture noted with varus angulation. Pubic rami unremarkable. IMPRESSION: Comminuted right intertrochanteric hip fracture with varus angulation. Electronically Signed   By: Misty Stanley M.D.   On: 04/16/2020 15:05               LOS: 1 day   Scarlett Portlock  [facility]   Pager on [email or web address]. If 7PM-7AM, please contact night-coverage at [email or web address]     04/17/2020, 11:49 AM

## 2020-04-17 NOTE — Progress Notes (Signed)
Patient is seen and again reports that he is in severe pain.  We discussed that he is considered very high risk for operative intervention but he like to proceed with surgery for pain relief.  We will get him scheduled on the schedule for tomorrow morning.  Risk benefits possible complications discussed.

## 2020-04-17 NOTE — Plan of Care (Signed)

## 2020-04-17 NOTE — Progress Notes (Signed)
Spirometry with graph performed. Hard copy shown to Dr. Lanney Gins and placed on patient's chart.

## 2020-04-17 NOTE — Progress Notes (Signed)
Patient says he doesn't want to do skin prep at this time, because he hasn't spoken to the doctor enough to make an exact plan of treatment.  He says the Dr. Has not told him of possibly having surgery, so he wants to wait.

## 2020-04-18 ENCOUNTER — Encounter
Admission: EM | Disposition: A | Discharge status: Skilled Nursing Facility | Payer: Self-pay | Source: Skilled Nursing Facility | Attending: Internal Medicine

## 2020-04-18 ENCOUNTER — Inpatient Hospital Stay: Admitting: Anesthesiology

## 2020-04-18 ENCOUNTER — Inpatient Hospital Stay

## 2020-04-18 ENCOUNTER — Inpatient Hospital Stay (HOSPITAL_COMMUNITY)
Admit: 2020-04-18 | Discharge: 2020-04-18 | Disposition: A | Discharge status: Home / Self Care | Attending: Pulmonary Disease | Admitting: Pulmonary Disease

## 2020-04-18 DIAGNOSIS — I5021 Acute systolic (congestive) heart failure: Secondary | ICD-10-CM

## 2020-04-18 HISTORY — PX: INTRAMEDULLARY (IM) NAIL INTERTROCHANTERIC: SHX5875

## 2020-04-18 LAB — CBC WITH DIFFERENTIAL/PLATELET
Abs Immature Granulocytes: 0.07 10*3/uL (ref 0.00–0.07)
Basophils Absolute: 0 10*3/uL (ref 0.0–0.1)
Basophils Relative: 0 %
Eosinophils Absolute: 0 10*3/uL (ref 0.0–0.5)
Eosinophils Relative: 0 %
HCT: 28.7 % — ABNORMAL LOW (ref 39.0–52.0)
Hemoglobin: 9.2 g/dL — ABNORMAL LOW (ref 13.0–17.0)
Immature Granulocytes: 1 %
Lymphocytes Relative: 4 %
Lymphs Abs: 0.4 10*3/uL — ABNORMAL LOW (ref 0.7–4.0)
MCH: 28.2 pg (ref 26.0–34.0)
MCHC: 32.1 g/dL (ref 30.0–36.0)
MCV: 88 fL (ref 80.0–100.0)
Monocytes Absolute: 0.7 10*3/uL (ref 0.1–1.0)
Monocytes Relative: 7 %
Neutro Abs: 8.9 10*3/uL — ABNORMAL HIGH (ref 1.7–7.7)
Neutrophils Relative %: 88 %
Platelets: 158 10*3/uL (ref 150–400)
RBC: 3.26 MIL/uL — ABNORMAL LOW (ref 4.22–5.81)
RDW: 13.2 % (ref 11.5–15.5)
WBC: 10.1 10*3/uL (ref 4.0–10.5)
nRBC: 0 % (ref 0.0–0.2)

## 2020-04-18 LAB — BASIC METABOLIC PANEL
Anion gap: 11 (ref 5–15)
BUN: 11 mg/dL (ref 6–20)
CO2: 36 mmol/L — ABNORMAL HIGH (ref 22–32)
Calcium: 7.8 mg/dL — ABNORMAL LOW (ref 8.9–10.3)
Chloride: 88 mmol/L — ABNORMAL LOW (ref 98–111)
Creatinine, Ser: 0.51 mg/dL — ABNORMAL LOW (ref 0.61–1.24)
GFR, Estimated: >60 mL/min (ref >60)
Glucose, Bld: 96 mg/dL (ref 70–99)
Potassium: 4 mmol/L (ref 3.5–5.1)
Sodium: 135 mmol/L (ref 135–145)

## 2020-04-18 LAB — ECHOCARDIOGRAM COMPLETE
Height: 69 in
S' Lateral: 3.55 cm
Weight: 1360 oz

## 2020-04-18 SURGERY — FIXATION, FRACTURE, INTERTROCHANTERIC, WITH INTRAMEDULLARY ROD
Anesthesia: Choice | Site: Hip | Laterality: Right

## 2020-04-18 MED ORDER — BISACODYL 10 MG RE SUPP: 10.0000 mg | Freq: Every day | RECTAL | Status: DC | PRN

## 2020-04-18 MED ORDER — ENOXAPARIN SODIUM 30 MG/0.3ML ~~LOC~~ SOLN
30.0000 mg | SUBCUTANEOUS | Status: DC
  Administered 2020-04-19 – 2020-04-22 (×4): 30 mg via SUBCUTANEOUS
  Filled 2020-04-18 (×4): qty 0.3

## 2020-04-18 MED ORDER — PROPOFOL 500 MG/50ML IV EMUL
INTRAVENOUS | Status: DC | PRN
  Administered 2020-04-18: 50 ug/kg/min via INTRAVENOUS

## 2020-04-18 MED ORDER — ACETAMINOPHEN 10 MG/ML IV SOLN
INTRAVENOUS | Status: DC | PRN
  Administered 2020-04-18: 500 mg via INTRAVENOUS

## 2020-04-18 MED ORDER — PHENOL 1.4 % MT LIQD
1.0000 | OROMUCOSAL | Status: DC | PRN
  Filled 2020-04-18: qty 177

## 2020-04-18 MED ORDER — LACTATED RINGERS IV SOLN: INTRAVENOUS | Status: DC | PRN

## 2020-04-18 MED ORDER — FENTANYL CITRATE (PF) 100 MCG/2ML IJ SOLN
INTRAMUSCULAR | Status: AC
  Filled 2020-04-18: qty 2

## 2020-04-18 MED ORDER — FENTANYL CITRATE (PF) 100 MCG/2ML IJ SOLN: 25.0000 ug | INTRAMUSCULAR | Status: DC | PRN

## 2020-04-18 MED ORDER — FENTANYL CITRATE (PF) 100 MCG/2ML IJ SOLN
INTRAMUSCULAR | Status: DC | PRN
  Administered 2020-04-18 (×2): 25 ug via INTRAVENOUS
  Administered 2020-04-18: 50 ug via INTRAVENOUS

## 2020-04-18 MED ORDER — METOCLOPRAMIDE HCL 5 MG/ML IJ SOLN: 5.0000 mg | Freq: Three times a day (TID) | INTRAMUSCULAR | Status: DC | PRN

## 2020-04-18 MED ORDER — PHENYLEPHRINE HCL-NACL 10-0.9 MG/250ML-% IV SOLN: 0.0000 ug/min | INTRAVENOUS | Status: DC

## 2020-04-18 MED ORDER — SODIUM CHLORIDE 0.9 % IV SOLN
250.0000 mL | INTRAVENOUS | Status: DC
  Administered 2020-04-18: 250 mL via INTRAVENOUS

## 2020-04-18 MED ORDER — ONDANSETRON HCL 4 MG PO TABS: 4.0000 mg | ORAL_TABLET | Freq: Four times a day (QID) | ORAL | Status: DC | PRN

## 2020-04-18 MED ORDER — PROPOFOL 10 MG/ML IV BOLUS
INTRAVENOUS | Status: AC
  Filled 2020-04-18: qty 20

## 2020-04-18 MED ORDER — IPRATROPIUM-ALBUTEROL 0.5-2.5 (3) MG/3ML IN SOLN
RESPIRATORY_TRACT | Status: AC
  Administered 2020-04-18: 3 mL via RESPIRATORY_TRACT
  Filled 2020-04-18: qty 3

## 2020-04-18 MED ORDER — HYDROMORPHONE HCL 1 MG/ML IJ SOLN: 0.5000 mg | INTRAMUSCULAR | Status: DC | PRN

## 2020-04-18 MED ORDER — ONDANSETRON HCL 4 MG/2ML IJ SOLN: 4.0000 mg | Freq: Four times a day (QID) | INTRAMUSCULAR | Status: DC | PRN

## 2020-04-18 MED ORDER — PHENYLEPHRINE HCL (PRESSORS) 10 MG/ML IV SOLN
200.0000 ug | Freq: Once | INTRAVENOUS | Status: AC
  Administered 2020-04-18: 150 ug via INTRAVENOUS
  Filled 2020-04-18: qty 0.02

## 2020-04-18 MED ORDER — OXYCODONE HCL 5 MG PO TABS: 5.0000 mg | ORAL_TABLET | Freq: Once | ORAL | Status: DC | PRN

## 2020-04-18 MED ORDER — METOCLOPRAMIDE HCL 10 MG PO TABS: 5.0000 mg | ORAL_TABLET | Freq: Three times a day (TID) | ORAL | Status: DC | PRN

## 2020-04-18 MED ORDER — ONDANSETRON HCL 4 MG/2ML IJ SOLN: 4.0000 mg | Freq: Once | INTRAMUSCULAR | Status: DC | PRN

## 2020-04-18 MED ORDER — PROPOFOL 10 MG/ML IV BOLUS
INTRAVENOUS | Status: DC | PRN
  Administered 2020-04-18 (×2): 20 mg via INTRAVENOUS

## 2020-04-18 MED ORDER — MENTHOL 3 MG MT LOZG
1.0000 | LOZENGE | OROMUCOSAL | Status: DC | PRN
  Filled 2020-04-18: qty 9

## 2020-04-18 MED ORDER — ALUM & MAG HYDROXIDE-SIMETH 200-200-20 MG/5ML PO SUSP: 30.0000 mL | ORAL | Status: DC | PRN

## 2020-04-18 MED ORDER — DOCUSATE SODIUM 100 MG PO CAPS
100.0000 mg | ORAL_CAPSULE | Freq: Two times a day (BID) | ORAL | Status: DC
  Administered 2020-04-18 – 2020-04-22 (×4): 100 mg via ORAL
  Filled 2020-04-18 (×7): qty 1

## 2020-04-18 MED ORDER — MAGNESIUM HYDROXIDE 400 MG/5ML PO SUSP: 30.0000 mL | Freq: Every day | ORAL | Status: DC | PRN

## 2020-04-18 MED ORDER — PHENYLEPHRINE HCL-NACL 10-0.9 MG/250ML-% IV SOLN
25.0000 ug/min | INTRAVENOUS | Status: DC
  Filled 2020-04-18: qty 250

## 2020-04-18 MED ORDER — MAGNESIUM CITRATE PO SOLN
1.0000 | Freq: Once | ORAL | Status: DC | PRN
  Filled 2020-04-18: qty 296

## 2020-04-18 MED ORDER — CEFAZOLIN SODIUM-DEXTROSE 1-4 GM/50ML-% IV SOLN
1.0000 g | Freq: Four times a day (QID) | INTRAVENOUS | Status: AC
  Administered 2020-04-18 – 2020-04-19 (×3): 1 g via INTRAVENOUS
  Filled 2020-04-18 (×3): qty 50

## 2020-04-18 MED ORDER — LORAZEPAM 2 MG/ML IJ SOLN
0.5000 mg | Freq: Once | INTRAMUSCULAR | Status: AC
  Administered 2020-04-18: 0.5 mg via INTRAVENOUS
  Filled 2020-04-18: qty 1

## 2020-04-18 MED ORDER — NEOMYCIN-POLYMYXIN B GU 40-200000 IR SOLN
Status: DC | PRN
  Administered 2020-04-18: 2 mL

## 2020-04-18 MED ORDER — BUPIVACAINE HCL (PF) 0.5 % IJ SOLN
INTRAMUSCULAR | Status: DC | PRN
  Administered 2020-04-18: 2.3 mL via INTRATHECAL

## 2020-04-18 MED ORDER — OXYCODONE HCL 5 MG/5ML PO SOLN: 5.0000 mg | Freq: Once | ORAL | Status: DC | PRN

## 2020-04-18 MED ORDER — IPRATROPIUM-ALBUTEROL 0.5-2.5 (3) MG/3ML IN SOLN: 3.0000 mL | Freq: Once | RESPIRATORY_TRACT | Status: AC

## 2020-04-18 MED ORDER — PHENYLEPHRINE HCL (PRESSORS) 10 MG/ML IV SOLN
INTRAVENOUS | Status: DC | PRN
  Administered 2020-04-18 (×3): 100 ug via INTRAVENOUS

## 2020-04-18 SURGICAL SUPPLY — 36 items
BIT DRILL CANN LG 4.3MM (BIT) ×1 IMPLANT
CANISTER SUCT 1200ML W/VALVE (MISCELLANEOUS) IMPLANT
CHLORAPREP W/TINT 26 (MISCELLANEOUS) ×3 IMPLANT
COVER WAND RF STERILE (DRAPES) IMPLANT
DRAPE 3/4 80X56 (DRAPES) ×3 IMPLANT
DRAPE U-SHAPE 47X51 STRL (DRAPES) ×3 IMPLANT
DRILL BIT CANN LG 4.3MM (BIT) ×3
DRSG OPSITE POSTOP 3X4 (GAUZE/BANDAGES/DRESSINGS) ×3 IMPLANT
DRSG OPSITE POSTOP 4X6 (GAUZE/BANDAGES/DRESSINGS) IMPLANT
GAUZE XEROFORM 1X8 LF (GAUZE/BANDAGES/DRESSINGS) ×3 IMPLANT
GLOVE BIOGEL PI IND STRL 9 (GLOVE) ×1 IMPLANT
GLOVE BIOGEL PI INDICATOR 9 (GLOVE) ×2
GLOVE SURG SYN 9.0  PF PI (GLOVE) ×3
GLOVE SURG SYN 9.0 PF PI (GLOVE) ×1 IMPLANT
GOWN SRG 2XL LVL 4 RGLN SLV (GOWNS) ×1 IMPLANT
GOWN STRL NON-REIN 2XL LVL4 (GOWNS) ×3
GOWN STRL REUS W/ TWL LRG LVL3 (GOWN DISPOSABLE) ×1 IMPLANT
GOWN STRL REUS W/TWL LRG LVL3 (GOWN DISPOSABLE) ×3
HIP FRAC NAIL LAG SCR 10.5X100 (Orthopedic Implant) ×3 IMPLANT
KIT TURNOVER KIT A (KITS) ×3 IMPLANT
MANIFOLD NEPTUNE II (INSTRUMENTS) IMPLANT
MAT ABSORB  FLUID 56X50 GRAY (MISCELLANEOUS) ×3
MAT ABSORB FLUID 56X50 GRAY (MISCELLANEOUS) ×1 IMPLANT
NAIL HIP FRACT 130D 9X180 (Orthopedic Implant) ×3 IMPLANT
NEEDLE FILTER BLUNT 18X 1/2SAF (NEEDLE) ×2
NEEDLE FILTER BLUNT 18X1 1/2 (NEEDLE) ×1 IMPLANT
NS IRRIG 500ML POUR BTL (IV SOLUTION) ×3 IMPLANT
PACK HIP COMPR (MISCELLANEOUS) ×3 IMPLANT
SCALPEL PROTECTED #15 DISP (BLADE) ×6 IMPLANT
SCREW BONE CORTICAL 5.0X38 (Screw) ×3 IMPLANT
SCREW CANN THRD AFF 10.5X100 (Orthopedic Implant) ×1 IMPLANT
STAPLER SKIN PROX 35W (STAPLE) ×3 IMPLANT
SUT VIC AB 1 CT1 36 (SUTURE) ×3 IMPLANT
SUT VIC AB 2-0 CT1 (SUTURE) ×3 IMPLANT
SYR 10ML LL (SYRINGE) ×3 IMPLANT
SYR BULB IRRIG 60ML STRL (SYRINGE) ×3 IMPLANT

## 2020-04-18 NOTE — Transfer of Care (Signed)
Immediate Anesthesia Transfer of Care Note  Patient: Gordon Rubio  Procedure(s) Performed: INTRAMEDULLARY (IM) NAIL INTERTROCHANTRIC (Right Hip)  Patient Location: PACU  Anesthesia Type:General  Level of Consciousness: awake, alert  and oriented  Airway & Oxygen Therapy: Patient Spontanous Breathing and Patient connected to face mask oxygen  Post-op Assessment: Report given to RN and Post -op Vital signs reviewed and stable  Post vital signs: Reviewed and stable  Last Vitals:  Vitals Value Taken Time  BP    Temp    Pulse    Resp    SpO2      Last Pain:  Vitals:   04/18/20 0854  TempSrc:   PainSc: 8       Patients Stated Pain Goal: 1 (46/43/14 2767)  Complications: No complications documented.

## 2020-04-18 NOTE — Anesthesia Postprocedure Evaluation (Signed)
Anesthesia Post Note  Patient: [name]  Procedure(s) Performed: INTRAMEDULLARY (IM) NAIL INTERTROCHANTRIC (Right Hip)  Patient location during evaluation: PACU Anesthesia Type: Combined General/Spinal Level of consciousness: oriented and awake and alert Pain management: pain level controlled Vital Signs Assessment: post-procedure vital signs reviewed and stable Respiratory status: spontaneous breathing, respiratory function stable and patient connected to nasal cannula oxygen Cardiovascular status: blood pressure returned to baseline and stable Postop Assessment: no headache, no backache and no apparent nausea or vomiting Anesthetic complications: no   No complications documented.   Last Vitals:  Vitals:   04/18/20 1341 04/18/20 1425  BP: 105/74 126/84  Pulse: (!) 106 (!) 116  Resp: 20 19  Temp: 36.7 C (!) 36.3 C  SpO2: 94% 99%    Last Pain:  Vitals:   04/18/20 1425  TempSrc: Oral  PainSc:                  Arita Miss

## 2020-04-18 NOTE — Progress Notes (Addendum)
Progress Note    Gordon Rubio  OZD:[id number] DOB: [birth year]  DOA: 04/16/2020 PCP: Einar Crow, MD      Brief Narrative:    Medical records reviewed and are as summarized below:  Gordon Rubio is a 54 y.o. 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  He presented to the hospital after a fall and complaint of right hip.  He was found to have right hip fracture.    Assessment/Plan:   Principal Problem:   Closed right hip fracture [facility]) Active Problems:   Fall   Peripheral T-cell lymphoma ([place])   End stage COPD ([place])   Depression with anxiety   Hypokalemia   Hypocalcemia   Crohn disease ([place])   Hypomagnesemia   Chronic pain syndrome   Protein-calorie malnutrition, severe (HCC)   Nutrition Problem: Inadequate oral intake Etiology: inability to eat  Signs/Symptoms: NPO status   Body mass index is 12.55 kg/m.  (Underweight)   Right hip fracture, s/p fall: s/p intramedullary nail of intertrochanteric right hip fracture on 04/18/2020.  Continue IV Dilaudid and Percocet as needed for pain.  Continue MS Contin for chronic pain. PT and OT evaluation.  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  He was on hospice prior to admission.  End-stage COPD with chronic hypoxemic respiratory failure.  Continue bronchodilators.  Continue 3 L/min oxygen via nasal cannula.  Hypokalemia: Improved  Advanced Crohn's disease: Continue Entocort  Osteoporosis multiple wedge compression fractures of the mid thoracic spine and upper lumbar spine.  Continue analgesics.  Depression and anxiety: Continue psychotropics     Diet Order            Diet regular Room service appropriate? Yes; Fluid consistency: Thin  Diet  effective now                    Consultants:  [place] surgeon  Procedures:  Intramedullary nail right hip on 04/18/2020    Medications:   . budesonide  9 mg Oral q AM  . calcium citrate  400 mg of elemental calcium Oral Daily  . docusate sodium  100 mg Oral BID  . [START ON 04/19/2020] enoxaparin (LOVENOX) injection  30 mg Subcutaneous Q24H  . feeding supplement  237 mL Oral BID BM  . LORazepam  0.5 mg Oral QID  . mirtazapine  45 mg Oral QHS  . morphine  90 mg Oral Q12H  . OLANZapine  10 mg Oral QHS  . OLANZapine  5 mg Oral Daily  . pantoprazole  40 mg Oral Daily  . sertraline  100 mg Oral Daily   Continuous Infusions: . sodium chloride    . 0.9 % NaCl with KCl 40 mEq / L 75 mL/hr at 04/18/20 1451  .  ceFAZolin (ANCEF) IV    . phenylephrine (NEO-SYNEPHRINE) Adult infusion       Anti-infectives (From admission, onward)   Start     Dose/Rate Route Frequency Ordered Stop   04/18/20 1500  ceFAZolin (ANCEF) IVPB 1 g/50 mL premix        1 g 100 mL/hr over 30 Minutes Intravenous Every 6 hours 04/18/20 1401 04/19/20 0859   04/18/20 1100  ceFAZolin (ANCEF) IVPB 1 g/50 mL premix  1 g 100 mL/hr over 30 Minutes Intravenous  Once 04/17/20 1238 04/18/20 1026             Family Communication/Anticipated D/C date and plan/Code Status   DVT prophylaxis: enoxaparin (LOVENOX) injection 30 mg Start: 04/19/20 0800 SCDs Start: 04/18/20 1401 SCDs Start: 04/16/20 1653     Code Status: DNR  Family Communication: Discussed with his wife at the bedside Disposition Plan:    Status is: Inpatient  Remains inpatient appropriate because:Unsafe d/c plan and Inpatient level of care appropriate due to severity of illness   Dispo: The patient is from: Home              Anticipated d/c is to: SNF              Anticipated d/c date is: > 3 days              Patient currently is not medically stable to d/c.           Subjective:    Interval events noted.  C/o pain in the right hip.  Gordon Rubio, her HPOA, is at the bedside.  Objective:    Vitals:   04/18/20 1325 04/18/20 1341 04/18/20 1425 04/18/20 1530  BP: 120/84 105/74 126/84 106/82  Pulse: (!) 109 (!) 106 (!) 116 (!) 119  Resp: (!) 22 20 19 18   Temp:  98 F (36.7 C) (!) 97.3 F (36.3 C) 98.1 F (36.7 C)  TempSrc:  Oral Oral Oral  SpO2: 95% 94% 99% 96%  Weight:      Height:       No data found.   Intake/Output Summary (Last 24 hours) at 04/18/2020 1542 Last data filed at 04/18/2020 1044 Gross per 24 hour  Intake 1153.72 ml  Output 310 ml  Net 843.72 ml   Filed Weights   04/16/20 1344  Weight: 38.6 kg    Exam:  GEN: NAD SKIN: Warm and dry EYES: No pallor or icterus ENT: MMM CV: RRR PULM: Decreased air entry bilaterally.  No wheezing or rales heard ABD: soft, ND, NT, +BS CNS: AAO x 3, non focal EXT: No edema or tenderness    Data Reviewed:   I have personally reviewed following labs and imaging studies:  Labs: Labs show the following:   Basic Metabolic Panel: Recent Labs  Lab 04/16/20 1348 04/16/20 1348 04/17/20 0337 04/18/20 0549  NA 134*  --  130* 135  K 2.2*   < > 3.3* 4.0  CL 79*  --  77* 88*  CO2 40*  --  41* 36*  GLUCOSE 126*  --  112* 96  BUN 7  --  7 11  CREATININE 0.64  --  0.64 0.51*  CALCIUM 5.4*  --  7.1* 7.8*  MG 0.6*  --  2.0  --   PHOS 2.8  --  3.6  --    < > = values in this interval not displayed.   GFR Estimated Creatinine Clearance: 57 mL/min (A) (by C-G formula based on SCr of 0.51 mg/dL (L)). Liver Function Tests: Recent Labs  Lab 04/16/20 1348  AST 33  ALT 18  ALKPHOS 116  BILITOT 0.7  PROT 5.1*  ALBUMIN 2.1*   No results for input(s): LIPASE, AMYLASE in the last 168 hours. No results for input(s): AMMONIA in the last 168 hours. Coagulation profile Recent Labs  Lab 04/16/20 1702  INR 1.0    CBC: Recent Labs  Lab 04/16/20 1348 04/17/20 0337 04/18/20 0549  WBC 7.5 9.6 10.1   NEUTROABS  --   --  8.9*  HGB 9.9* 9.0* 9.2*  HCT 31.7* 29.1* 28.7*  MCV 87.8 87.1 88.0  PLT 175 167 158   Cardiac Enzymes: No results for input(s): CKTOTAL, CKMB, CKMBINDEX, TROPONINI in the last 168 hours. BNP (last 3 results) No results for input(s): PROBNP in the last 8760 hours. CBG: No results for input(s): GLUCAP in the last 168 hours. D-Dimer: No results for input(s): DDIMER in the last 72 hours. Hgb A1c: No results for input(s): HGBA1C in the last 72 hours. Lipid Profile: No results for input(s): CHOL, HDL, LDLCALC, TRIG, CHOLHDL, LDLDIRECT in the last 72 hours. Thyroid function studies: No results for input(s): TSH, T4TOTAL, T3FREE, THYROIDAB in the last 72 hours.  Invalid input(s): FREET3 Anemia work up: No results for input(s): VITAMINB12, FOLATE, FERRITIN, TIBC, IRON, RETICCTPCT in the last 72 hours. Sepsis Labs: Recent Labs  Lab 04/16/20 1348 04/17/20 0337 04/18/20 0549  WBC 7.5 9.6 10.1    Microbiology Recent Results (from the past 240 hour(s))  Resp Panel by RT-PCR (Flu A&B, Covid) Nasopharyngeal Swab     Status: None   Collection Time: 04/16/20  2:41 PM   Specimen: Nasopharyngeal Swab; Nasopharyngeal(NP) swabs in vial transport medium  Result Value Ref Range Status   SARS Coronavirus 2 by RT PCR NEGATIVE NEGATIVE Final    Comment: (NOTE) SARS-CoV-2 target nucleic acids are NOT DETECTED.  The SARS-CoV-2 RNA is generally detectable in upper respiratory specimens during the acute phase of infection. The lowest concentration of SARS-CoV-2 viral copies this assay can detect is 138 copies/mL. A negative result does not preclude SARS-Cov-2 infection and should not be used as the sole basis for treatment or other patient management decisions. A negative result may occur with  improper specimen collection/handling, submission of specimen other than nasopharyngeal swab, presence of viral mutation(s) within the areas targeted by this assay, and inadequate  number of viral copies(<138 copies/mL). A negative result must be combined with clinical observations, patient history, and epidemiological information. The expected result is Negative.  Fact Sheet for Patients:  EntrepreneurPulse.com.au  Fact Sheet for Healthcare Providers:  IncredibleEmployment.be  This test is no t yet approved or cleared by the [place] FDA and  has been authorized for detection and/or diagnosis of SARS-CoV-2 by FDA under an Emergency Use Authorization (EUA). This EUA will remain  in effect (meaning this test can be used) for the duration of the COVID-19 declaration under Section 564(b)(1) of the Act, 21 U.S.C.section 360bbb-3(b)(1), unless the authorization is terminated  or revoked sooner.       Influenza A by PCR NEGATIVE NEGATIVE Final   Influenza B by PCR NEGATIVE NEGATIVE Final    Comment: (NOTE) The Xpert Xpress SARS-CoV-2/FLU/RSV plus assay is intended as an aid in the diagnosis of influenza from Nasopharyngeal swab specimens and should not be used as a sole basis for treatment. Nasal washings and aspirates are unacceptable for Xpert Xpress SARS-CoV-2/FLU/RSV testing.  Fact Sheet for Patients: EntrepreneurPulse.com.au  Fact Sheet for Healthcare Providers: IncredibleEmployment.be  This test is not yet approved or cleared by the [place] FDA and has been authorized for detection and/or diagnosis of SARS-CoV-2 by FDA under an Emergency Use Authorization (EUA). This EUA will remain in effect (meaning this test can be used) for the duration of the COVID-19 declaration under Section 564(b)(1) of the Act, 21 U.S.C. section 360bbb-3(b)(1), unless the authorization is terminated or revoked.  Performed at [facility], [phone]  [address] [place], [place] [address]     Procedures and diagnostic studies:  ECHOCARDIOGRAM COMPLETE  Result Date: 04/18/2020     ECHOCARDIOGRAM REPORT   Patient Name:   Gordon Rubio Date of Exam: 04/18/2020 Medical Rec #:  [id number]       Height:       69.0 in Accession #:    [id number]      Weight:       85.0 lb Date of Birth:  [birth year]       BSA:          1.436 m Patient Age:    61 years        BP:           110/91 mmHg Patient Gender: M               HR:           108 bpm. Exam Location:  [facility] Procedure: 2D Echo, Color Doppler and Cardiac Doppler Indications:    CHF-acute systolic 297.98  History:        Patient has no prior history of Echocardiogram examinations.                 COPD.  Sonographer:    Sherrie Sport RDCS (AE) Referring Phys: [id number] FUAD ALESKEROV Diagnosing      Kate Sable MD Phys:  Sonographer Comments: Technically difficult study due to poor echo windows, no apical window and suboptimal parasternal window. Image acquisition challenging due to COPD. IMPRESSIONS  1. Left ventricular ejection fraction, by estimation, is 40 to 45%. The left ventricle has mild to moderately decreased function. The left ventricle demonstrates global hypokinesis. Left ventricular diastolic function could not be evaluated.  2. Right ventricular systolic function is low normal. The right ventricular size is normal.  3. The mitral valve is normal in structure. No evidence of mitral valve regurgitation.  4. The aortic valve was not well visualized. Aortic valve regurgitation is not visualized.  5. The inferior vena cava is dilated in size with >50% respiratory variability, suggesting right atrial pressure of 8 mmHg. FINDINGS  Left Ventricle: Left ventricular ejection fraction, by estimation, is 40 to 45%. The left ventricle has mild to moderately decreased function. The left ventricle demonstrates global hypokinesis. The left ventricular internal cavity size was normal in size. There is no left ventricular hypertrophy. Left ventricular diastolic function could not be evaluated. Right Ventricle: The right ventricular size is normal. No  increase in right ventricular wall thickness. Right ventricular systolic function is low normal. Left Atrium: Left atrial size was normal in size. Right Atrium: Right atrial size was normal in size. Pericardium: There is no evidence of pericardial effusion. Mitral Valve: The mitral valve is normal in structure. No evidence of mitral valve regurgitation. Tricuspid Valve: The tricuspid valve is normal in structure. Tricuspid valve regurgitation is not demonstrated. Aortic Valve: The aortic valve was not well visualized. Aortic valve regurgitation is not visualized. Pulmonic Valve: The pulmonic valve was not well visualized. Pulmonic valve regurgitation is not visualized. Aorta: The aortic root is normal in size and structure. Venous: The inferior vena cava is dilated in size with greater than 50% respiratory variability, suggesting right atrial pressure of 8 mmHg. IAS/Shunts: The interatrial septum was not assessed.  LEFT VENTRICLE PLAX 2D LVIDd:         4.25 cm LVIDs:         3.55 cm LV PW:  1.02 cm LV IVS:        0.76 cm LVOT diam:     2.10 cm LVOT Area:     3.46 cm  LEFT ATRIUM         Index LA diam:    2.20 cm 1.53 cm/m                        PULMONIC VALVE AORTA                 PV Vmax:        0.40 m/s Ao Root diam: 2.50 cm PV Peak grad:   0.6 mmHg                       RVOT Peak grad: 2 mmHg   SHUNTS Systemic Diam: 2.10 cm Kate Sable MD Electronically signed by Kate Sable MD Signature Date/Time: 04/18/2020/1:33:46 PM    Final    DG HIP OPERATIVE UNILAT W OR W/O PELVIS RIGHT  Result Date: 04/18/2020 CLINICAL DATA:  ORIF of right hip fracture 33 seconds. Images: 4 EXAM: OPERATIVE RIGHT HIP (WITH PELVIS IF PERFORMED) 4 VIEWS TECHNIQUE: Fluoroscopic spot image(s) were submitted for interpretation post-operatively. COMPARISON:  None. FINDINGS: A gamma nail and intramedullary rod were placed across the right hip fracture during the study. IMPRESSION: ORIF right hip fracture as above.  Electronically Signed   By: Dorise Bullion III M.D   On: 04/18/2020 12:22               LOS: 2 days   Charle Mclaurin  [facility]   Pager on [email or web address]. If 7PM-7AM, please contact night-coverage at [email or web address]     04/18/2020, 3:42 PM

## 2020-04-18 NOTE — TOC Progression Note (Signed)
Transition of Care [facility]) - Progression Note    Patient Details  Name: Gordon Rubio MRN: [id number] Date of Birth: 03/12/1966  [facility] CM/SW Contact  Tiajuana Amass [place], [place] Phone Number: [phone] 04/18/2020, 1:25 PM  Clinical Narrative:  [facility] team for discharge planning. Pt had right hip fracture repaired today. Plan is for SNF placement post discharge.         Expected Discharge Plan and Services                                                 Social Determinants of Health (SDOH) Interventions    Readmission Risk Interventions No flowsheet data found.

## 2020-04-18 NOTE — Plan of Care (Signed)
  Problem: Education: Goal: Individualized Educational Video(s) Outcome: Progressing   Problem: Self-Concept: Goal: Ability to maintain and perform role responsibilities to the fullest extent possible will improve Outcome: Progressing   Problem: Pain Management: Goal: Pain level will decrease Outcome: Progressing   Problem: Activity: Goal: Ability to ambulate and perform ADLs will improve Outcome: Progressing

## 2020-04-18 NOTE — Progress Notes (Signed)
   04/18/20 0132  Assess: MEWS Score  Temp 98.3 F (36.8 C)  BP 98/75  Pulse Rate (!) 110  Resp 18  SpO2 100 %  O2 Device Nasal Cannula  Assess: MEWS Score  MEWS Temp 0  MEWS Systolic 1  MEWS Pulse 1  MEWS RR 0  MEWS LOC 0  MEWS Score 2  MEWS Score Color Yellow  Assess: if the MEWS score is Yellow or Red  Were vital signs taken at a resting state? Yes  Focused Assessment No change from prior assessment  Early Detection of Sepsis Score *See Row Information* Low  MEWS guidelines implemented *See Row Information* Yes  Treat  MEWS Interventions Escalated (See documentation below)  Pain Scale 0-10  Pain Score 6  Pain Type Chronic pain  Pain Location Generalized  Pain Orientation Right  Pain Descriptors / Indicators Aching  Pain Frequency Constant  Pain Onset On-going  Pain Intervention(s) Medication (See eMAR)  Escalate  MEWS: Escalate Yellow: discuss with charge nurse/RN and consider discussing with provider and RRT  Notify: Charge Nurse/RN  Name of Charge Nurse/RN Notified Kasey, RN   Date Charge Nurse/RN Notified 04/18/20  Time Charge Nurse/RN Notified 0140

## 2020-04-18 NOTE — Progress Notes (Signed)
*  PRELIMINARY RESULTS* Echocardiogram 2D Echocardiogram has been performed.  Gordon Rubio 04/18/2020, 8:40 AM

## 2020-04-18 NOTE — Plan of Care (Signed)
  Problem: Self-Concept: Goal: Ability to maintain and perform role responsibilities to the fullest extent possible will improve Outcome: Not Progressing   Problem: Pain Management: Goal: Pain level will decrease Outcome: Not Progressing

## 2020-04-18 NOTE — Anesthesia Preprocedure Evaluation (Addendum)
Anesthesia Evaluation  Patient identified by MRN, date of birth, ID band Patient awake    Reviewed: Allergy & Precautions, NPO status , Patient's Chart, lab work & pertinent test results  History of Anesthesia Complications Negative for: history of anesthetic complications  Airway Mallampati: I  TM Distance: >3 FB Neck ROM: Full    Dental  (+) Edentulous Upper, Edentulous Lower   Pulmonary shortness of breath and at rest, neg sleep apnea, COPD,  COPD inhaler and oxygen dependent, Patient abstained from smoking.Not current smoker, former smoker,  Alpha 1 antitrypsin deficiency.  PFTs 03/2017: Parameter Absolute Value % Predicted  FEV1 1.22 L 34%  FVC 3.20 L 68%  FEV1/FVC 0.38   DLCO 8.39 28%  Spirometry demonstrates severe obstruction with acute bronchodilator response. Post-BD spirometry remains obstructed consistent with COPD with bronchodilator response. FVC is reduced suggesting moderate restriction or gas trapping (gas trapping more likely given obstruction). Recommend formal lung volumes to fully evaluate. DLco is severely reduced.  Impression: Severe obstruction with bronchodilator response. Severe diffusion impairment.      + decreased breath sounds      Cardiovascular Exercise Tolerance: Poor METS(-) hypertension+ CAD and + Past MI  (-) dysrhythmias  Rhythm:Regular Rate:Normal - Systolic murmurs ECHOCARDIOGRAM W COLORFLOW SPECTRAL DOPPLER  Narrative  Normal left ventricular systolic function, ejection fraction > 55%  Dilated right ventricle - moderate  Normal right ventricular systolic function  Dilated right atrium - mild  No significant valvular abnormalities      Neuro/Psych PSYCHIATRIC DISORDERS Anxiety Depression negative neurological ROS  negative psych ROS   GI/Hepatic neg GERD  ,(+)     (-) substance abuse  ,   Endo/Other  neg diabetes  Renal/GU negative Renal ROS     Musculoskeletal    Abdominal   Peds  Hematology   Anesthesia Other Findings Past Medical History: No date: Cancer ([place]) No date: Chronic pain syndrome No date: COPD (chronic obstructive pulmonary disease) (HCC) No date: Crohn's disease (HCC) No date: Peripheral T-cell lymphoma (HCC)  Reproductive/Obstetrics                           Anesthesia Physical Anesthesia Plan  ASA: IV  Anesthesia Plan: General/Spinal   Post-op Pain Management:    Induction: Intravenous  PONV Risk Score and Plan: 3 and Ondansetron, Dexamethasone, Propofol infusion and TIVA  Airway Management Planned: Natural Airway  Additional Equipment: None  Intra-op Plan:   Post-operative Plan: Possible Post-op intubation/ventilation  Informed Consent: I have reviewed the patients History and Physical, chart, labs and discussed the procedure including the risks, benefits and alternatives for the proposed anesthesia with the patient or authorized representative who has indicated his/her understanding and acceptance.   Patient has DNR.  Discussed DNR with patient.     Plan Discussed with: CRNA and Surgeon  Anesthesia Plan Comments: (Discussed R/B/A of neuraxial anesthesia technique with patient: - rare risks of spinal/epidural hematoma, nerve damage, infection - Risk of PDPH - Risk of nausea and vomiting - Risk of conversion to general anesthesia and its associated risks, including sore throat, damage to lips/teeth/oropharynx, and rare risks such as cardiac and respiratory events.  Patient counseled on being higher risk for anesthesia due to comorbidities: end stage COPD. Patient was told about increased risk of cardiac and respiratory events, including death. Patient understands.  Discussed DNR in detail. Patient under no circumstances wants chest compressions intraoperatively even if life saving. He is understanding that intubation may be  necessary for case or for intraop respiratory failure. He is OK  with that, but does not want to be on machines long term. He said he does not want to be on any machines or ventilators for longer than a week. Relayed information to Dr Rudene Christians who is agreeable to proceed with surgery.  Patient voiced understanding.)       Anesthesia Quick Evaluation

## 2020-04-18 NOTE — Anesthesia Procedure Notes (Signed)
Spinal  Patient location during procedure: OR Start time: 04/18/2020 10:05 AM End time: 04/18/2020 10:10 AM Staffing Performed: anesthesiologist  Anesthesiologist: Arita Miss, MD Preanesthetic Checklist Completed: patient identified, IV checked, site marked, risks and benefits discussed, surgical consent, monitors and equipment checked, pre-op evaluation and timeout performed Spinal Block Patient position: left lateral decubitus Prep: ChloraPrep Patient monitoring: heart rate, continuous pulse ox, blood pressure and cardiac monitor Approach: midline Location: L3-4 Injection technique: single-shot Needle Needle type: Quincke  Needle gauge: 22 G Needle length: 9 cm Assessment Sensory level: T10 Additional Notes Negative paresthesia. Negative blood return. Positive free-flowing CSF. Expiration date of kit checked and confirmed. Patient tolerated procedure well, without complications.

## 2020-04-18 NOTE — Op Note (Signed)
04/18/2020  10:49 AM  PATIENT:  Gordon Rubio  54 y.o. male  PRE-OPERATIVE DIAGNOSIS:  right hip fracture intertrochanteric  POST-OPERATIVE DIAGNOSIS: Same  PROCEDURE:  Procedure(s): INTRAMEDULLARY (IM) NAIL INTERTROCHANTRIC (Right)  SURGEON: Laurene Footman, MD  ASSISTANTS: None  ANESTHESIA:   spinal  EBL:  Total I/O In: 450 [I.V.:400; IV Piggyback:50] Out: 10 [Blood:10]  BLOOD ADMINISTERED:none  DRAINS: none   LOCAL MEDICATIONS USED:  NONE  SPECIMEN:  No Specimen  DISPOSITION OF SPECIMEN:  N/A  COUNTS:  YES  TOURNIQUET:  * No tourniquets in log *  IMPLANTS: Biomet affixes 9 x 180 rod with 105 mm leg screw and 36 mm distal interlocking screw  DICTATION: .Dragon Dictation patient was brought to the operating room and after adequate anesthesia was obtained the patient was transferred to the fracture table left leg in the well-leg holder right foot in the traction boot with traction applied.  C arm was brought in and good visualization in both AP and lateral projection showed anatomic alignment.  After prepping and draping in the usual sterile fashion appropriate patient identification and timeout procedure were completed and a small incision was made at the tip of the trochanter.  Guidewire was inserted followed by proximal reaming and then placement of the rod.  When it was in the appropriate position a lateral incision was made and a guidewire inserted in the center center position measurement made and leg screw inserted with permanent C-arm views obtained.  The setscrew was set proximally to allow for further compression with tightening and then a quarter turn off.  Next the distal interlocking screw was used using the dynamic screw hole drilling measuring and placing the 36 mm cortical screw with permanent C-arm view showing it was appropriate length.  The wounds were then thoroughly irrigated and closed with #1 Vicryl for the deep fascia 2-0 Vicryl subcutaneously and skin  staples.  Xeroform and honeycomb dressings then applied.  PLAN OF CARE: Admit to inpatient   PATIENT DISPOSITION:  PACU - hemodynamically stable.

## 2020-04-19 ENCOUNTER — Encounter: Payer: Self-pay | Admitting: Orthopedic Surgery

## 2020-04-19 ENCOUNTER — Inpatient Hospital Stay

## 2020-04-19 LAB — BASIC METABOLIC PANEL
Anion gap: 6 (ref 5–15)
Anion gap: 9 (ref 5–15)
BUN: 11 mg/dL (ref 6–20)
BUN: 13 mg/dL (ref 6–20)
CO2: 35 mmol/L — ABNORMAL HIGH (ref 22–32)
CO2: 33 mmol/L — ABNORMAL HIGH (ref 22–32)
Calcium: 7.7 mg/dL — ABNORMAL LOW (ref 8.9–10.3)
Calcium: 8 mg/dL — ABNORMAL LOW (ref 8.9–10.3)
Chloride: 96 mmol/L — ABNORMAL LOW (ref 98–111)
Chloride: 96 mmol/L — ABNORMAL LOW (ref 98–111)
Creatinine, Ser: 0.43 mg/dL — ABNORMAL LOW (ref 0.61–1.24)
Creatinine, Ser: 0.45 mg/dL — ABNORMAL LOW (ref 0.61–1.24)
GFR, Estimated: >60 mL/min (ref >60)
GFR, Estimated: >60 mL/min (ref >60)
Glucose, Bld: 72 mg/dL (ref 70–99)
Glucose, Bld: 132 mg/dL — ABNORMAL HIGH (ref 70–99)
Potassium: 4.4 mmol/L (ref 3.5–5.1)
Potassium: 5.2 mmol/L — ABNORMAL HIGH (ref 3.5–5.1)
Sodium: 137 mmol/L (ref 135–145)
Sodium: 138 mmol/L (ref 135–145)

## 2020-04-19 LAB — CBC
HCT: 24.8 % — ABNORMAL LOW (ref 39.0–52.0)
HCT: 28.1 % — ABNORMAL LOW (ref 39.0–52.0)
Hemoglobin: 7.8 g/dL — ABNORMAL LOW (ref 13.0–17.0)
Hemoglobin: 8.6 g/dL — ABNORMAL LOW (ref 13.0–17.0)
MCH: 28.1 pg (ref 26.0–34.0)
MCH: 27.7 pg (ref 26.0–34.0)
MCHC: 31.5 g/dL (ref 30.0–36.0)
MCHC: 30.6 g/dL (ref 30.0–36.0)
MCV: 89.2 fL (ref 80.0–100.0)
MCV: 90.6 fL (ref 80.0–100.0)
Platelets: 135 10*3/uL — ABNORMAL LOW (ref 150–400)
Platelets: 180 10*3/uL (ref 150–400)
RBC: 2.78 MIL/uL — ABNORMAL LOW (ref 4.22–5.81)
RBC: 3.1 MIL/uL — ABNORMAL LOW (ref 4.22–5.81)
RDW: 13.2 % (ref 11.5–15.5)
RDW: 13.4 % (ref 11.5–15.5)
WBC: 5.9 10*3/uL (ref 4.0–10.5)
WBC: 9.1 10*3/uL (ref 4.0–10.5)
nRBC: 0 % (ref 0.0–0.2)
nRBC: 0 % (ref 0.0–0.2)

## 2020-04-19 LAB — BLOOD GAS, ARTERIAL
Acid-Base Excess: 9.3 mmol/L — ABNORMAL HIGH (ref 0.0–2.0)
Bicarbonate: 38.4 mmol/L — ABNORMAL HIGH (ref 20.0–28.0)
O2 Saturation: 93.3 %
Patient temperature: 37
pCO2 arterial: 78 mmHg (ref 32.0–48.0)
pH, Arterial: 7.3 — ABNORMAL LOW (ref 7.350–7.450)
pO2, Arterial: 75 mmHg — ABNORMAL LOW (ref 83.0–108.0)

## 2020-04-19 LAB — HEPATIC FUNCTION PANEL
ALT: 19 U/L (ref 0–44)
AST: 26 U/L (ref 15–41)
Albumin: 2.1 g/dL — ABNORMAL LOW (ref 3.5–5.0)
Alkaline Phosphatase: 112 U/L (ref 38–126)
Bilirubin, Direct: <0.1 mg/dL (ref 0.0–0.2)
Total Bilirubin: 0.9 mg/dL (ref 0.3–1.2)
Total Protein: 5.4 g/dL — ABNORMAL LOW (ref 6.5–8.1)

## 2020-04-19 LAB — LACTIC ACID, PLASMA
Lactic Acid, Venous: 0.8 mmol/L (ref 0.5–1.9)
Lactic Acid, Venous: 0.7 mmol/L (ref 0.5–1.9)

## 2020-04-19 LAB — BRAIN NATRIURETIC PEPTIDE: B Natriuretic Peptide: 517.4 pg/mL — ABNORMAL HIGH (ref 0.0–100.0)

## 2020-04-19 LAB — PROCALCITONIN: Procalcitonin: 0.16 ng/mL

## 2020-04-19 MED ORDER — SODIUM CHLORIDE 0.9 % IV BOLUS
500.0000 mL | Freq: Once | INTRAVENOUS | Status: AC
  Administered 2020-04-19: 500 mL via INTRAVENOUS

## 2020-04-19 MED ORDER — AZITHROMYCIN 250 MG PO TABS
250.0000 mg | ORAL_TABLET | Freq: Every day | ORAL | Status: DC
  Administered 2020-04-19 – 2020-04-22 (×4): 250 mg via ORAL
  Filled 2020-04-19 (×4): qty 1

## 2020-04-19 MED ORDER — METHYLPREDNISOLONE SODIUM SUCC 40 MG IJ SOLR
40.0000 mg | Freq: Two times a day (BID) | INTRAMUSCULAR | Status: DC
  Administered 2020-04-19 – 2020-04-21 (×5): 40 mg via INTRAVENOUS
  Filled 2020-04-19 (×5): qty 1

## 2020-04-19 MED ORDER — IPRATROPIUM-ALBUTEROL 0.5-2.5 (3) MG/3ML IN SOLN
RESPIRATORY_TRACT | Status: AC
  Filled 2020-04-19: qty 3

## 2020-04-19 MED ORDER — IOHEXOL 350 MG/ML SOLN
75.0000 mL | Freq: Once | INTRAVENOUS | Status: AC | PRN
  Administered 2020-04-19: 75 mL via INTRAVENOUS

## 2020-04-19 MED ORDER — IPRATROPIUM-ALBUTEROL 0.5-2.5 (3) MG/3ML IN SOLN
3.0000 mL | Freq: Four times a day (QID) | RESPIRATORY_TRACT | Status: DC
  Administered 2020-04-19 – 2020-04-20 (×3): 3 mL via RESPIRATORY_TRACT
  Filled 2020-04-19 (×3): qty 3

## 2020-04-19 MED ORDER — FE FUMARATE-B12-VIT C-FA-IFC PO CAPS
1.0000 | ORAL_CAPSULE | Freq: Two times a day (BID) | ORAL | Status: DC
  Administered 2020-04-19 – 2020-04-22 (×5): 1 via ORAL
  Filled 2020-04-19 (×8): qty 1

## 2020-04-19 MED ORDER — MORPHINE SULFATE ER 30 MG PO TBCR: 90.0000 mg | EXTENDED_RELEASE_TABLET | Freq: Two times a day (BID) | ORAL | Status: DC

## 2020-04-19 MED ORDER — OXYCODONE-ACETAMINOPHEN 5-325 MG PO TABS
1.0000 | ORAL_TABLET | ORAL | Status: DC | PRN
  Administered 2020-04-20: 1 via ORAL
  Filled 2020-04-19: qty 1

## 2020-04-19 MED ORDER — ACETAMINOPHEN 325 MG PO TABS
650.0000 mg | ORAL_TABLET | Freq: Four times a day (QID) | ORAL | Status: DC
  Administered 2020-04-19 – 2020-04-22 (×10): 650 mg via ORAL
  Filled 2020-04-19 (×10): qty 2

## 2020-04-19 NOTE — Plan of Care (Signed)
  Problem: Activity: Goal: Ability to ambulate and perform ADLs will improve Outcome: Not Progressing

## 2020-04-19 NOTE — Progress Notes (Addendum)
Pulmonary Medicine          Date: 04/19/2020,   MRN# [id number] Gordon Rubio [birth year]     AdmissionWeight: 38.6 kg                 CurrentWeight: 38.6 kg Referring physician: Dr. Blaine Hamper     CHIEF COMPLAINT:   Advanced centrilobular emphysema with requirement pulmonary for presurgical evaluation   HISTORY OF PRESENT ILLNESS   Pleasant 54 year old male with centrilobular and panlobular emphysema chronic hypoxemia with 3 L O2 requirement at baseline as well as alpha-1 antitrypsin deficiency, history of Crohn's disease, chemotherapy-induced neuropathy, rheumatoid arthritis, osteoporosis, nephrolithiasis and T-cell lymphoma, depression, chronic pain syndrome, protein calorie malnutrition of moderate severity, history of lifelong smoking, chronic diarrhea, is currently with hospice and presents to the hospital after mechanical fall status post right hip fracture.  In the ER he was found to have relatively unimpressive CBC and BMP chest x-ray was done with emphysematous appearing lungs bilaterally however without pulmonary edema pneumothorax or consolidated infiltrate, COVID-19 was negative and vitals relatively stable on home O2 requirement.  Patient was evaluated by orthopedic surgery with discovery of high intratrochanteric hip fracture with likely requiring surgical intervention for repair.  Pulmonary consultation was placed for presurgical evaluation due to complex and advanced pulmonary comorbid history.  -After discussion with patient regarding risk factor and specifically his high risk of post operative respiratory failure patient explains that he has had numerous surgeries in the past and had no issues and this time he is in severe pain and immobilized so he insists that operation be done as he would not wish to live with such severe pain nor inability to ambulate.     04/19/20- patient is s/p surgery, he has transient hypotension and is with labored breathing but he is  lucid and answered all questions accurately on 1st attempt.  He has ABG ordered.  I will oder neb tx and solumedrol , discussed with RT Sullivan Lone. Patient has BIPAP NIV trelegy at home.   Spirometric data 04/17/20  Media Information   Document Information  Photos    04/17/2020 11:58  Attached To:  Hospital Encounter on 04/16/20  Source Information  Ottie Glazier, MD  [facility] (1a)     PAST MEDICAL HISTORY   Past Medical History:  Diagnosis Date  . Cancer ([place])   . Chronic pain syndrome   . COPD (chronic obstructive pulmonary disease) ([place])   . Crohn's disease ([place])   . 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 BOWEL RESECTION    . INTRAMEDULLARY (IM) NAIL INTERTROCHANTERIC Right 04/18/2020   Procedure: INTRAMEDULLARY (IM) NAIL INTERTROCHANTRIC;  Surgeon: Hessie Knows, MD;  Location: [facility] ORS;  Service: Orthopedics;  Laterality: Right;     FAMILY HISTORY   Family History  Problem Relation Age of Onset  . COPD Mother   . Stroke Father      SOCIAL HISTORY   Social History   Tobacco Use  . Smoking status: Former Research scientist (life sciences)  . Smokeless tobacco: Never Used  Substance Use Topics  . Alcohol use: Not Currently  . Drug use: Never     MEDICATIONS    Home Medication:    Current Medication:  Current Facility-Administered Medications:  .  0.9 %  sodium chloride infusion, 250 mL, Intravenous, Continuous, Jennye Boroughs, MD, Paused at 04/18/20 1559 .  acetaminophen (TYLENOL) tablet 650 mg, 650 mg,  Oral, Q6H, George Hugh, MD, 650 mg at 04/19/20 1249 .  albuterol (VENTOLIN HFA) 108 (90 Base) MCG/ACT inhaler 2 puff, 2 puff, Inhalation, Q4H PRN, Hessie Knows, MD, 2 puff at 04/17/20 1032 .  alum & mag hydroxide-simeth (MAALOX/MYLANTA) 200-200-20 MG/5ML suspension 30 mL, 30 mL, Oral, Q4H PRN, Hessie Knows, MD .  bisacodyl (DULCOLAX) suppository 10 mg, 10 mg, Rectal, Daily PRN, Hessie Knows, MD .  budesonide (ENTOCORT EC) 24 hr capsule 9 mg, 9 mg, Oral, q AM, Hessie Knows, MD, 9 mg at 04/19/20 0929 .  calcium citrate (CALCITRATE - dosed in mg elemental calcium) tablet 400 mg of elemental calcium, 400 mg of elemental calcium, Oral, Daily, Hessie Knows, MD, 400 mg of elemental calcium at 04/19/20 0916 .  dextromethorphan-guaiFENesin ([place] DM) 30-600 MG per 12 hr tablet 1 tablet, 1 tablet, Oral, BID PRN, Hessie Knows, MD .  docusate sodium (COLACE) capsule 100 mg, 100 mg, Oral, BID, Hessie Knows, MD, 100 mg at 04/19/20 0915 .  enoxaparin (LOVENOX) injection 30 mg, 30 mg, Subcutaneous, Q24H, Hessie Knows, MD, 30 mg at 04/19/20 0756 .  feeding supplement (ENSURE ENLIVE / ENSURE PLUS) liquid 237 mL, 237 mL, Oral, BID BM, Hessie Knows, MD, 237 mL at 04/19/20 1416 .  ferrous [id number] C-folic acid (TRINSICON / FOLTRIN) capsule 1 capsule, 1 capsule, Oral, BID, Duanne Guess, PA-C, 1 capsule at 04/19/20 0915 .  magnesium citrate solution 1 Bottle, 1 Bottle, Oral, Once PRN, Hessie Knows, MD .  magnesium hydroxide (MILK OF MAGNESIA) suspension 30 mL, 30 mL, Oral, Daily PRN, Hessie Knows, MD .  menthol-cetylpyridinium (CEPACOL) lozenge 3 mg, 1 lozenge, Oral, PRN **OR** phenol (CHLORASEPTIC) mouth spray 1 spray, 1 spray, Mouth/Throat, PRN, Hessie Knows, MD .  methocarbamol (ROBAXIN) tablet 500 mg, 500 mg, Oral, Q8H PRN, Hessie Knows, MD, 500 mg at 04/19/20 0930 .  metoCLOPramide (REGLAN) tablet 5-10 mg, 5-10 mg, Oral, Q8H PRN **OR** metoCLOPramide (REGLAN) injection 5-10 mg, 5-10 mg, Intravenous, Q8H PRN, Hessie Knows, MD .  mirtazapine (REMERON) tablet 45 mg, 45 mg, Oral, QHS, Hessie Knows, MD, 45 mg at 04/18/20 2101 .  OLANZapine (ZYPREXA) tablet 10 mg, 10 mg, Oral, QHS, Hessie Knows, MD, 10 mg at 04/18/20 2100 .  OLANZapine (ZYPREXA) tablet 5 mg, 5 mg, Oral, Daily, Hessie Knows, MD, 5 mg at 04/18/20 2101 .  ondansetron (ZOFRAN) tablet 4 mg, 4 mg, Oral, Q6H  PRN **OR** ondansetron (ZOFRAN) injection 4 mg, 4 mg, Intravenous, Q6H PRN, Hessie Knows, MD .  pantoprazole (PROTONIX) EC tablet 40 mg, 40 mg, Oral, Daily, Hessie Knows, MD, 40 mg at 04/19/20 0930 .  phenylephrine (NEOSYNEPHRINE) 10-0.9 MG/250ML-% infusion, 25-200 mcg/min, Intravenous, Titrated, Jennye Boroughs, MD .  senna-docusate (Senokot-S) tablet 1 tablet, 1 tablet, Oral, QHS PRN, Hessie Knows, MD .  sertraline (ZOLOFT) tablet 100 mg, 100 mg, Oral, Daily, Hessie Knows, MD, 100 mg at 04/18/20 2059 .  sodium chloride 0.9 % bolus 500 mL, 500 mL, Intravenous, Once, Masoud, Jarrett Soho, MD    ALLERGIES   Doxycycline, Sulfa antibiotics, Sulfamethoxazole, and Glycopyrrolate-formoterol     REVIEW OF SYSTEMS    Review of Systems:  Gen:  Denies  fever, sweats, chills weigh loss  HEENT: Denies blurred vision, double vision, ear pain, eye pain, hearing loss, nose bleeds, sore throat Cardiac:  No dizziness, chest pain or heaviness, chest tightness,edema Resp:   Denies cough or sputum porduction, shortness of breath,wheezing, hemoptysis,  Gi: Denies swallowing difficulty, stomach pain, nausea or vomiting, diarrhea, constipation, bowel incontinence Gu:  Denies bladder incontinence, burning urine Ext:   Denies Joint pain, stiffness or swelling Skin: Denies  skin rash, easy bruising or bleeding or hives Endoc:  Denies polyuria, polydipsia , polyphagia or weight change Psych:   Denies depression, insomnia or hallucinations   Other: reports pain severe and improved with IV daulaudid, severe hip pain post fracture   VS: BP (!) 85/63 (BP Location: Right Arm)   Pulse 99   Temp 97.6 F (36.4 C) (Oral)   Resp 12   Ht 5\' 9"  (1.753 m)   Wt 38.6 kg   SpO2 100%   BMI 12.55 kg/m      PHYSICAL EXAM    GENERAL:NAD, no fevers, chills, no weakness no fatigue HEAD: Normocephalic, atraumatic.  EYES: Pupils equal, round, reactive to light. Extraocular muscles intact. No scleral icterus.  MOUTH:  Moist mucosal membrane. Dentition intact. No abscess noted.  EAR, NOSE, THROAT: Clear without exudates. No external lesions.  NECK: Supple. No thyromegaly. No nodules. No JVD.  PULMONARY: decreased bs bilaterally no wheezing no rhonchi CARDIOVASCULAR: S1 and S2. Regular rate and rhythm. No murmurs, rubs, or gallops. No edema. Pedal pulses 2+ bilaterally.  GASTROINTESTINAL: Soft, nontender, nondistended. No masses. Positive bowel sounds. No hepatosplenomegaly.  MUSCULOSKELETAL:broken r hip with pain and tenderness NEUROLOGIC: Cranial nerves II through XII are intact. No gross focal neurological deficits. Sensation intact. Reflexes intact.  SKIN: No ulceration, lesions, rashes, or cyanosis. Skin warm and dry. Turgor intact.  PSYCHIATRIC: Mood, affect within normal limits. The patient is awake, alert and oriented x 3. Insight, judgment intact.       IMAGING    DG Chest 1 View  Result Date: 04/16/2020 CLINICAL DATA:  Right hip pain after a fall today. EXAM: CHEST  1 VIEW COMPARISON:  None. FINDINGS: A Port-A-Cath terminates over the right atrium. The cardiac silhouette is normal in size. There is mild prominence of the central pulmonary arteries. No airspace consolidation, edema, pleural effusion, or pneumothorax is identified. Surgical clips are noted in the right upper abdomen. No acute osseous abnormality is identified. IMPRESSION: No active disease. Electronically Signed   By: Logan Bores M.D.   On: 04/16/2020 15:05   CT Head Wo Contrast  Result Date: 04/16/2020 CLINICAL DATA:  Head trauma, minor, normal mental status. Additional history provided: Fall. EXAM: CT HEAD WITHOUT CONTRAST TECHNIQUE: Contiguous axial images were obtained from the base of the skull through the vertex without intravenous contrast. COMPARISON:  No pertinent prior exams available for comparison. FINDINGS: Brain: The examination is mildly motion degraded. There is no acute intracranial hemorrhage. No demarcated cortical  infarct. No extra-axial fluid collection. No evidence of intracranial mass. No midline shift. Vascular: No hyperdense vessel. Skull: Normal. Negative for fracture or focal lesion. Sinuses/Orbits: Visualized orbits show no acute finding. Tiny bilateral maxillary sinus mucous retention cyst. Trace right mastoid effusion IMPRESSION: Motion degraded examination. No evidence of acute intracranial abnormality. Trace right mastoid effusion. Electronically Signed   By: Kellie Simmering DO   On: 04/16/2020 14:21   ECHOCARDIOGRAM COMPLETE  Result Date: 04/18/2020    ECHOCARDIOGRAM REPORT   Patient Name:   Gordon Rubio Date of Exam: 04/18/2020 Medical Rec #:  [id number]       Height:       69.0 in Accession #:    [id number]      Weight:       85.0 lb Date of Birth:  31-Mar-1966       BSA:  1.436 m Patient Age:    38 years        BP:           110/91 mmHg Patient Gender: M               HR:           108 bpm. Exam Location:  [facility] Procedure: 2D Echo, Color Doppler and Cardiac Doppler Indications:    CHF-acute systolic 546.56  History:        Patient has no prior history of Echocardiogram examinations.                 COPD.  Sonographer:    Sherrie Sport RDCS (AE) Referring Phys: [id number] [place] Doster Diagnosing      Kate Sable MD Phys:  Sonographer Comments: Technically difficult study due to poor echo windows, no apical window and suboptimal parasternal window. Image acquisition challenging due to COPD. IMPRESSIONS  1. Left ventricular ejection fraction, by estimation, is 40 to 45%. The left ventricle has mild to moderately decreased function. The left ventricle demonstrates global hypokinesis. Left ventricular diastolic function could not be evaluated.  2. Right ventricular systolic function is low normal. The right ventricular size is normal.  3. The mitral valve is normal in structure. No evidence of mitral valve regurgitation.  4. The aortic valve was not well visualized. Aortic valve regurgitation is not  visualized.  5. The inferior vena cava is dilated in size with >50% respiratory variability, suggesting right atrial pressure of 8 mmHg. FINDINGS  Left Ventricle: Left ventricular ejection fraction, by estimation, is 40 to 45%. The left ventricle has mild to moderately decreased function. The left ventricle demonstrates global hypokinesis. The left ventricular internal cavity size was normal in size. There is no left ventricular hypertrophy. Left ventricular diastolic function could not be evaluated. Right Ventricle: The right ventricular size is normal. No increase in right ventricular wall thickness. Right ventricular systolic function is low normal. Left Atrium: Left atrial size was normal in size. Right Atrium: Right atrial size was normal in size. Pericardium: There is no evidence of pericardial effusion. Mitral Valve: The mitral valve is normal in structure. No evidence of mitral valve regurgitation. Tricuspid Valve: The tricuspid valve is normal in structure. Tricuspid valve regurgitation is not demonstrated. Aortic Valve: The aortic valve was not well visualized. Aortic valve regurgitation is not visualized. Pulmonic Valve: The pulmonic valve was not well visualized. Pulmonic valve regurgitation is not visualized. Aorta: The aortic root is normal in size and structure. Venous: The inferior vena cava is dilated in size with greater than 50% respiratory variability, suggesting right atrial pressure of 8 mmHg. IAS/Shunts: The interatrial septum was not assessed.  LEFT VENTRICLE PLAX 2D LVIDd:         4.25 cm LVIDs:         3.55 cm LV PW:         1.02 cm LV IVS:        0.76 cm LVOT diam:     2.10 cm LVOT Area:     3.46 cm  LEFT ATRIUM         Index LA diam:    2.20 cm 1.53 cm/m                        PULMONIC VALVE AORTA                 PV Vmax:  0.40 m/s Ao Root diam: 2.50 cm PV Peak grad:   0.6 mmHg                       RVOT Peak grad: 2 mmHg   SHUNTS Systemic Diam: 2.10 cm Kate Sable MD  Electronically signed by Kate Sable MD Signature Date/Time: 04/18/2020/1:33:46 PM    Final    DG HIP OPERATIVE UNILAT W OR W/O PELVIS RIGHT  Result Date: 04/18/2020 CLINICAL DATA:  ORIF of right hip fracture 33 seconds. Images: 4 EXAM: OPERATIVE RIGHT HIP (WITH PELVIS IF PERFORMED) 4 VIEWS TECHNIQUE: Fluoroscopic spot image(s) were submitted for interpretation post-operatively. COMPARISON:  None. FINDINGS: A gamma nail and intramedullary rod were placed across the right hip fracture during the study. IMPRESSION: ORIF right hip fracture as above. Electronically Signed   By: Dorise Bullion III M.D   On: 04/18/2020 12:22   DG HIP UNILAT W OR W/O PELVIS 2-3 VIEWS RIGHT  Result Date: 04/16/2020 CLINICAL DATA:  [place] on right hip. EXAM: DG HIP (WITH OR WITHOUT PELVIS) 2-3V RIGHT COMPARISON:  None. FINDINGS: Bones are diffusely demineralized. Comminuted right intertrochanteric hip fracture noted with varus angulation. Pubic rami unremarkable. IMPRESSION: Comminuted right intertrochanteric hip fracture with varus angulation. Electronically Signed   By: Misty Stanley M.D.   On: 04/16/2020 15:05         ASSESSMENT/PLAN    #1Advanced panlobular emphysema with chronic hypoxemia and alpha-1 antitrypsin deficiency Pulmonary status is currently close to baseline -Recommend incentive spirometry and chest physiotherapy throughout hospitalization -Typical COPD care path without exacerbation    #2Presurgical pulmonary evaluation -Comminuted right intertrochanteric hip fracture with varus Angulation. ARISCAT ([place]) preoperative pulmonary risk index in adults-42.1%-high risk for pulmonary intra and post operative complication [place] respiratory failure index:  High risk 10.1% risk of post operative respiratory failure [place] Safe-  Post operative Respiratory Failure Risk Calculator: 27.9% -High risk Postoperative respiratory failure (PRF)  Baseline advanced panlobular emphysema Severe protein  calorie malnutrition Pulmonary hypertension per TTE 03/16/2018 -will order repeat study prior to surgery Bronchoscopy 09/19/2017 - phlegm throughout airways -Spirometry 04/17/20 - FEV1 - 11% predicted >>>>consistent with known history of advanced panlobular emphysema and COPD GOLD stage 4D - consider pulmonary transplant evaluation  -Overall patient with high pre-test probability of post operative respiratory complications - if surgery is absolutely required patient may require prolonged intubation with difficulty weaning/liberating from mechanical ventilation and may require prolonged hospitalization.      #3Acute comminuted right hip fracture   -Orthopedic surgery on case-appreciate input   -Pulmonary presurgical evaluation & medical optimization in process  -s/p surge POD- 0    Thank you for allowing me to participate in the care of this patient.   Patient/Family are satisfied with care plan and all questions have been answered.   This document was prepared using Dragon voice recognition software and may include unintentional dictation errors.     Ottie Glazier, M.D.  Division of [place]

## 2020-04-19 NOTE — Progress Notes (Signed)
Physical Therapy Treatment Patient Details Name: Gordon Rubio MRN: [id number] DOB: [birth year] Today's Date: 04/19/2020    History of Present Illness  54 y.o. 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  S/p R hip ORIF 12/5.    PT Comments    Pt sleeping and difficult to wake on arrival, does acknowledge that he is very tired.  Pt was initially wanting to rest, but does finally agree to get back to bed; which turned out to be quite difficult.  He was able to manage a few minimal steps but then gave out and tried to sit before we were square to the bed and needed heavy assist with landing safely in the bed.  Pt did not have as much strength with supine exercises this afternoon, clearly exhausted with minimal exertion despite O2 remaining in the 90s (on 5L), HR 100-120s t/o.  Follow Up Recommendations  Supervision/Assistance - 24 hour     Equipment Recommendations  None recommended by PT    Recommendations for Other Services       Precautions / Restrictions Precautions Precautions: Fall Restrictions RLE Weight Bearing: Weight bearing as tolerated    Mobility  Bed Mobility Overal bed mobility: Needs Assistance Bed Mobility: Sit to Supine       Sit to supine: Mod assist;Max assist   General bed mobility comments: Pt very weak and worn out with minimal activity, needed heavy assist to get himself back in bed despite good (but unsuccessful) effort on his own to try  Transfers Overall transfer level: Needs assistance Equipment used: Rolling walker (2 wheeled) Transfers: Sit to/from Stand Sit to Stand: From elevated surface;Mod assist         General transfer comment: Pt uanble to rise from recliner w/o assist, though he did show good effort.  Pt attempted to sit well before  we were square to bed and needed max assist to insure he did not land on the ground.  Ambulation/Gait     Assistive device: Rolling walker (2 wheeled)       General Gait Details: Pt struggled with standing/stepping/upright this session.  He managed ~2 very small, labored side stduffle turning steps and ultimately gave out and needed assist to get back to bed w/o any real ambulation/appropriate steppage   Stairs             Wheelchair Mobility    Modified Rankin (Stroke Patients Only)       Balance Overall balance assessment: Needs assistance Sitting-balance support: Single extremity supported Sitting balance-Leahy Scale: Fair Sitting balance - Comments: Pt slouched at EOB and needing plenty of cuing just to sustain upright   Standing balance support: Bilateral upper extremity supported Standing balance-Leahy Scale: Zero Standing balance comment: highly reliant on walker, unable to maintain prolonged balance, quick to fatigue and with hunched posture                            Cognition Arousal/Alertness: Awake/alert Behavior During Therapy: WFL for tasks assessed/performed Overall Cognitive Status: Within Functional Limits for tasks assessed  Exercises General Exercises - Lower Extremity Ankle Circles/Pumps: AROM;10 reps Quad Sets: Strengthening;10 reps Short Arc Quad: Strengthening;AROM;10 reps Heel Slides: AAROM;10 reps Hip ABduction/ADduction: AROM;10 reps;AAROM    General Comments        Pertinent Vitals/Pain Pain Score: 8     Home Living                      Prior Function            PT Goals (current goals can now be found in the care plan section) Progress towards PT goals: Progressing toward goals    Frequency    BID      PT Plan Current plan remains appropriate    Co-evaluation              AM-PAC PT "6 Clicks" Mobility   Outcome Measure  Help  needed turning from your back to your side while in a flat bed without using bedrails?: A Little Help needed moving from lying on your back to sitting on the side of a flat bed without using bedrails?: A Little Help needed moving to and from a bed to a chair (including a wheelchair)?: A Lot Help needed standing up from a chair using your arms (e.g., wheelchair or bedside chair)?: A Little Help needed to walk in hospital room?: A Lot Help needed climbing 3-5 steps with a railing? : Total 6 Click Score: 14    End of Session Equipment Utilized During Treatment: Gait belt;Oxygen Activity Tolerance: Patient limited by fatigue;Patient limited by pain Patient left: with bed alarm set;with call bell/phone within reach Nurse Communication: Mobility status PT Visit Diagnosis: Muscle weakness (generalized) (M62.81);Unsteadiness on feet (R26.81);Pain;Difficulty in walking, not elsewhere classified (R26.2);History of falling (Z91.81) Pain - Right/Left: Right Pain - part of body: Hip     Time: 1355-1426 PT Time Calculation (min) (ACUTE ONLY): 31 min  Charges:  $Therapeutic Exercise: 8-22 mins $Therapeutic Activity: 8-22 mins                     Kreg Shropshire, DPT 04/19/2020, 3:35 PM

## 2020-04-19 NOTE — TOC Initial Note (Addendum)
Transition of Care [facility]) - Initial/Assessment Note    Patient Details  Name: Gordon Rubio MRN: [id number] Date of Birth: 09/02/1965  Transition of Care [facility]) CM/SW Contact:    Shelbie Ammons, RN Phone Number: 04/19/2020, 3:22 PM  Clinical Narrative:  RNCM briefly spoke with with patient who was sleepy but agreeable to bed search for skilled nursing although he would prefer to go back to [facility]. RNCM verified PASSR, completed FL-2 and started bed search. Full assessment will follow with patient is less sleepy.       RNCM reached out to Seth Bake with [facility] to inform her bed request had been sent.          Patient Goals and CMS Choice        Expected Discharge Plan and Services                                                Prior Living Arrangements/Services                       Activities of Daily Living Home Assistive Devices/Equipment: Dentures (specify type) (RW) ADL Screening (condition at time of admission) Patient's cognitive ability adequate to safely complete daily activities?: Yes Is the patient deaf or have difficulty hearing?: No Does the patient have difficulty seeing, even when wearing glasses/contacts?: No Does the patient have difficulty concentrating, remembering, or making decisions?: No Patient able to express need for assistance with ADLs?: No Does the patient have difficulty dressing or bathing?: No Independently performs ADLs?: Yes (appropriate for developmental age) Does the patient have difficulty walking or climbing stairs?: Yes Weakness of Legs: Both Weakness of Arms/Hands: Both  Permission Sought/Granted                  Emotional Assessment              Admission diagnosis:  Fall [W19.XXXA] Closed right hip fracture ([place]) [S72.001A] Patient Active Problem List   Diagnosis Date Noted  . Fall 04/16/2020  . Closed right hip fracture ([place]) 04/16/2020  . 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 Depression with anxiety 04/16/2020  . Hypokalemia 04/16/2020  . Hypocalcemia 04/16/2020  . Crohn disease ([place]) 04/16/2020  . Hypomagnesemia 04/16/2020  . Protein-calorie malnutrition, severe ([place]) 04/16/2020  . Chronic pain syndrome    PCP:  Einar Crow, MD Pharmacy:  No Pharmacies Listed    Social Determinants of Health (SDOH) Interventions    Readmission Risk Interventions No flowsheet data found.

## 2020-04-19 NOTE — Plan of Care (Addendum)
I received page blood pressure decreased to 83/57 mmHg.  At bedside, patient is confused and lethargic, and displays a labored breathing pattern. - I started patient on a bolus of Normal Saline immediately, and BP stabilized after 250 CCs.   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 - He received Dilaudid 1 mg and Ativan 0.5 mg at 1250 per patient request for postoperative pain. - Hold all sedating meds for now. - For respiratory status, I will check ABG and CXR STAT. - Once stable will send for Head CT and CTA to rule out PE given patient's symptoms and he is postop Day 1.  I discussed plan of care and guarded prognosis with sister over the phone who is his primary contact and POA. She confirmed patient's wishes and her wishes - that code status is DNR/DNI.  She explains patient was previously under hospice care, and she does not want pressors or any invasive procedures.    We will do our best to stabilize patient.   George Hugh, MD

## 2020-04-19 NOTE — Progress Notes (Incomplete)
Notified by aide of low bp 86/53. Pt in room eyes open but doesn't respond to name called, does respond/react  to sternal rub but continues to have difficulty answering basic orientation questions.  Responses are slow and at times none but he does make eye contact - MD notified via secure chat

## 2020-04-19 NOTE — NC FL2 (Signed)
[place] LEVEL OF CARE SCREENING TOOL     IDENTIFICATION  Patient Name: Gordon Rubio Birthdate: 22-Nov-1965 Sex: male Admission Date (Current Location): 04/16/2020  [place] and [place] Number:  Engineering geologist and Address:  [facility], [address], [place], [place] [address]      Provider Number: [id number]  Attending Physician Name and Address:  George Hugh, MD  Relative Name and Phone Number:  Ronie Spies [phone]    Current Level of Care: Hospital Recommended Level of Care: [place] Prior Approval Number:    Date Approved/Denied:   PASRR Number: [id number]  Discharge Plan: SNF    Current Diagnoses: Patient Active Problem List   Diagnosis Date Noted  . Fall 04/16/2020  . Closed right hip fracture ([place]) 04/16/2020  . 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 Depression with anxiety 04/16/2020  . Hypokalemia 04/16/2020  . Hypocalcemia 04/16/2020  . Crohn disease ([place]) 04/16/2020  . Hypomagnesemia 04/16/2020  . Protein-calorie malnutrition, severe ([place]) 04/16/2020  . Chronic pain syndrome     Orientation RESPIRATION BLADDER Height & Weight     Self, Time, Situation, Place  Normal External catheter Weight: 38.6 kg Height:  5\' 9"  (175.3 cm)  BEHAVIORAL SYMPTOMS/MOOD NEUROLOGICAL BOWEL NUTRITION STATUS      Continent Diet (Regular)  AMBULATORY STATUS COMMUNICATION OF NEEDS Skin   Extensive Assist Verbally Surgical wounds                       Personal Care Assistance Level of Assistance  Bathing, Feeding, Dressing Bathing Assistance: Limited assistance Feeding assistance: Independent Dressing Assistance: Limited assistance     Functional Limitations Info             SPECIAL CARE FACTORS FREQUENCY  PT (By licensed PT), OT (By licensed OT)                    Contractures Contractures Info: Not present    Additional  Factors Info  Code Status, Allergies Code Status Info: DNR Allergies Info: Doxycycline, Sulfa, Glycopyrrolate-formoterol           Current Medications (04/19/2020):  This is the current hospital active medication list Current Facility-Administered Medications  Medication Dose Route Frequency Provider Last Rate Last Admin  . 0.9 %  sodium chloride infusion  250 mL Intravenous Continuous Jennye Boroughs, MD   Paused at 04/18/20 1559  . 0.9 % NaCl with KCl 40 mEq / L  infusion   Intravenous Continuous Hessie Knows, MD 75 mL/hr at 04/19/20 0215 New Bag at 04/19/20 0215  . acetaminophen (TYLENOL) tablet 650 mg  650 mg Oral Q6H George Hugh, MD   650 mg at 04/19/20 1249  . albuterol (VENTOLIN HFA) 108 (90 Base) MCG/ACT inhaler 2 puff  2 puff Inhalation Q4H PRN Hessie Knows, MD   2 puff at 04/17/20 1032  . alum & mag hydroxide-simeth (MAALOX/MYLANTA) 200-200-20 MG/5ML suspension 30 mL  30 mL Oral Q4H PRN Hessie Knows, MD      . bisacodyl (DULCOLAX) suppository 10 mg  10 mg Rectal Daily PRN Hessie Knows, MD      . budesonide (ENTOCORT EC) 24 hr capsule 9 mg  9 mg Oral q AM Hessie Knows, MD   9 mg at 04/19/20 0929  . calcium citrate (CALCITRATE - dosed in mg elemental calcium) tablet 400 mg of elemental calcium  400 mg of elemental calcium Oral  Daily Hessie Knows, MD   400 mg of elemental calcium at 04/19/20 0916  . dextromethorphan-guaiFENesin (MUCINEX DM) 30-600 MG per 12 hr tablet 1 tablet  1 tablet Oral BID PRN Hessie Knows, MD      . docusate sodium (COLACE) capsule 100 mg  100 mg Oral BID Hessie Knows, MD   100 mg at 04/19/20 0915  . enoxaparin (LOVENOX) injection 30 mg  30 mg Subcutaneous Q24H Hessie Knows, MD   30 mg at 04/19/20 0756  . feeding supplement (ENSURE ENLIVE / ENSURE PLUS) liquid 237 mL  237 mL Oral BID BM Hessie Knows, MD   237 mL at 04/19/20 1416  . ferrous [id number] C-folic acid (TRINSICON / FOLTRIN) capsule 1 capsule  1 capsule Oral BID Duanne Guess,  PA-C   1 capsule at 04/19/20 0915  . HYDROmorphone (DILAUDID) injection 1 mg  1 mg Intravenous Q3H PRN Hessie Knows, MD   1 mg at 04/19/20 1250  . LORazepam (ATIVAN) tablet 0.5 mg  0.5 mg Oral QID Hessie Knows, MD   0.5 mg at 04/19/20 1250  . magnesium citrate solution 1 Bottle  1 Bottle Oral Once PRN Hessie Knows, MD      . magnesium hydroxide (MILK OF MAGNESIA) suspension 30 mL  30 mL Oral Daily PRN Hessie Knows, MD      . menthol-cetylpyridinium (CEPACOL) lozenge 3 mg  1 lozenge Oral PRN Hessie Knows, MD       Or  . phenol (CHLORASEPTIC) mouth spray 1 spray  1 spray Mouth/Throat PRN Hessie Knows, MD      . methocarbamol (ROBAXIN) tablet 500 mg  500 mg Oral Q8H PRN Hessie Knows, MD   500 mg at 04/19/20 0930  . metoCLOPramide (REGLAN) tablet 5-10 mg  5-10 mg Oral Q8H PRN Hessie Knows, MD       Or  . metoCLOPramide (REGLAN) injection 5-10 mg  5-10 mg Intravenous Q8H PRN Hessie Knows, MD      . mirtazapine (REMERON) tablet 45 mg  45 mg Oral QHS Hessie Knows, MD   45 mg at 04/18/20 2101  . morphine (MS CONTIN) 12 hr tablet 90 mg  90 mg Oral Q12H Hessie Knows, MD   90 mg at 04/19/20 0753  . OLANZapine (ZYPREXA) tablet 10 mg  10 mg Oral QHS Hessie Knows, MD   10 mg at 04/18/20 2100  . OLANZapine (ZYPREXA) tablet 5 mg  5 mg Oral Daily Hessie Knows, MD   5 mg at 04/18/20 2101  . ondansetron (ZOFRAN) tablet 4 mg  4 mg Oral Q6H PRN Hessie Knows, MD       Or  . ondansetron [facility]) injection 4 mg  4 mg Intravenous Q6H PRN Hessie Knows, MD      . oxyCODONE-acetaminophen (PERCOCET/ROXICET) 5-325 MG per tablet 1 tablet  1 tablet Oral Q6H PRN Hessie Knows, MD   1 tablet at 04/17/20 0316  . pantoprazole (PROTONIX) EC tablet 40 mg  40 mg Oral Daily Hessie Knows, MD   40 mg at 04/19/20 0930  . phenylephrine (NEOSYNEPHRINE) 10-0.9 MG/250ML-% infusion  25-200 mcg/min Intravenous Titrated Jennye Boroughs, MD      . senna-docusate (Senokot-S) tablet 1 tablet  1 tablet Oral QHS PRN Hessie Knows, MD       . sertraline (ZOLOFT) tablet 100 mg  100 mg Oral Daily Hessie Knows, MD   100 mg at 04/18/20 2059     Discharge Medications: Please see discharge summary for a list of discharge medications.  Relevant Imaging Results:  Relevant Lab Results:   Additional Information SS# [id number]  Shelbie Ammons, RN

## 2020-04-19 NOTE — Progress Notes (Signed)
Cross Cover  Patient right arm post  Contras extravasation initially looked very red and swoolen with warmth along entire forearm however, bandage in ac fossa very tight/ This was removed and arm elevated. Much improvement noted one hour later.  Early LLL pneumonia process on CT reported.  Procalcitonin 0.16.  Patient states he does not feel he has pneumonia. Denies symptoms.  Initiation of antibiotics not appropriate at this time, however, will need to be reassessed should symptoms develop.

## 2020-04-19 NOTE — Plan of Care (Signed)
  Problem: Activity: Goal: Ability to ambulate and perform ADLs will improve 04/19/2020 0204 by Nickola Major, RN Outcome: Not Progressing 04/19/2020 0203 by Nickola Major, RN Outcome: Not Progressing

## 2020-04-19 NOTE — Evaluation (Signed)
Physical Therapy Evaluation Patient Details Name: Gordon Rubio MRN: [id number] DOB: 1966-02-13 Today's Date: 04/19/2020   History of Present Illness   54 y.o. 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  S/p R hip ORIF 12/5.  Clinical Impression  Pt with HR in the 120-130s t/o majority of the session, sister reports that he is essentially never below 100 and that this is relatively normal for him.  He was on 5L with O2 remaining in the mid/low 90s most of the time.  He is having expected R hip pain but showed very good effort with PT session.  Pt had literally just moved into [facility] and had the fall/fx.  Pt will need STR at d/c when medically cleared.     Follow Up Recommendations Supervision/Assistance - 24 hour    Equipment Recommendations  None recommended by PT    Recommendations for Other Services       Precautions / Restrictions Precautions Precautions: Fall Restrictions Weight Bearing Restrictions: Yes RLE Weight Bearing: Weight bearing as tolerated      Mobility  Bed Mobility Overal bed mobility: Needs Assistance Bed Mobility: Supine to Sit     Supine to sit: Mod assist     General bed mobility comments: Pt did well with getting toward EOB, unable to lift torso up to EOB on his own, but with HHA for trunk and some assist with LE to EOB    Transfers Overall transfer level: Needs assistance Equipment used: Rolling walker (2 wheeled) Transfers: Sit to/from Stand Sit to Stand: From elevated surface;Min assist         General transfer comment: Elevated bed signficantly to assist with rise to standing, pt did better than expected and did not need heavy assist in getting to upright  Ambulation/Gait Ambulation/Gait assistance: Min assist Gait Distance  (Feet): 4 Feet Assistive device: Rolling walker (2 wheeled)       General Gait Details: Pt was able to take a few steps from EOB to recliner, he was reliant on the walker and hesitant to do a lot but ultimately he was safe with transition to recliner  Stairs            Wheelchair Mobility    Modified Rankin (Stroke Patients Only)       Balance Overall balance assessment: Needs assistance Sitting-balance support: Single extremity supported Sitting balance-Leahy Scale: Fair     Standing balance support: Bilateral upper extremity supported Standing balance-Leahy Scale: Fair Standing balance comment: highly reliant on walker, able to maintain balance but quick to fatigue and with hunched posture                             Pertinent Vitals/Pain Pain Assessment: 0-10 Pain Score: 8     Home Living Family/patient expects to be discharged to:: Skilled nursing facility                 Additional Comments: Pt had just moved into [place] the day before fall    Prior Function Level of Independence: Needs assistance   Gait / Transfers Assistance Needed: uses FWW, had been living alone (with a good amount of help) but just moved into ALF 1 day then had fall  Hand Dominance        Extremity/Trunk Assessment   Upper Extremity Assessment Upper Extremity Assessment: Generalized weakness (L UE grossly 3/5, R grossly 3+/5)    Lower Extremity Assessment Lower Extremity Assessment: Generalized weakness (R LE pain limited <3/5, L LE grossly 4-/5)       Communication   Communication: No difficulties  Cognition Arousal/Alertness: Awake/alert Behavior During Therapy: WFL for tasks assessed/performed Overall Cognitive Status: Within Functional Limits for tasks assessed                                        General Comments      Exercises General Exercises - Lower Extremity Ankle Circles/Pumps: AROM;10 reps Quad Sets:  Strengthening;10 reps Short Arc Quad: Strengthening;AROM;10 reps Heel Slides: AAROM;10 reps (with resisted leg extensions) Hip ABduction/ADduction: AROM;10 reps   Assessment/Plan    PT Assessment Patient needs continued PT services  PT Problem List         PT Treatment Interventions DME instruction;Gait training;Functional mobility training;Therapeutic activities;Therapeutic exercise;Neuromuscular re-education;Balance training;Patient/family education    PT Goals (Current goals can be found in the Care Plan section)  Acute Rehab PT Goals Patient Stated Goal: go to rehab PT Goal Formulation: With patient Time For Goal Achievement: 05/03/20 Potential to Achieve Goals: Fair    Frequency BID   Barriers to discharge        Co-evaluation               AM-PAC PT "6 Clicks" Mobility  Outcome Measure Help needed turning from your back to your side while in a flat bed without using bedrails?: A Little Help needed moving from lying on your back to sitting on the side of a flat bed without using bedrails?: A Little Help needed moving to and from a bed to a chair (including a wheelchair)?: A Lot Help needed standing up from a chair using your arms (e.g., wheelchair or bedside chair)?: A Little Help needed to walk in hospital room?: A Lot Help needed climbing 3-5 steps with a railing? : Total 6 Click Score: 14    End of Session Equipment Utilized During Treatment: Gait belt;Oxygen (5-6 L (pt uses 3 at baseline)) Activity Tolerance: Patient limited by fatigue;Patient limited by pain Patient left: with chair alarm set;with call bell/phone within reach;with family/visitor present Nurse Communication: Mobility status PT Visit Diagnosis: Muscle weakness (generalized) (M62.81);Unsteadiness on feet (R26.81);Pain;Difficulty in walking, not elsewhere classified (R26.2);History of falling (Z91.81) Pain - Right/Left: Right Pain - part of body: Hip    Time: 0810-0850 PT Time Calculation  (min) (ACUTE ONLY): 40 min   Charges:   PT Evaluation $PT Eval Low Complexity: 1 Low PT Treatments $Therapeutic Exercise: 8-22 mins $Therapeutic Activity: 8-22 mins        Kreg Shropshire, DPT 04/19/2020, 11:01 AM

## 2020-04-19 NOTE — Progress Notes (Addendum)
[place] [facility] ([place] patient RN note:  Gordon Rubio is a current hospice patient with a terminal diagnosis of end stage COPD due to alpha 1 antitrypsin deficiency, who presented with fall and right hip pain to the Emergency Department on 12.03. Patient recently moved to [facility] on 12.01. Pt states that he fell when was walking and tripped. He hit his head, and injured his right hip. He developed right hip pain, which is constant, sharp, and severe. No loss of consciousness. No unilateral numbness or tingling in his extremities. He was admitted 12.03 with intertrochanteric hip fracture with severe osteopenia. On 12.05 he underwent an  intramedullary nail of intertrochanteric right hip fracture. He is a DNR and this was a related admission per Dr. Gilford Rile with [employer].  Visited patient at bedside. He was up to the chair, sleepy but aroused and answered questions appropriately. He described his pain rating at an 8 which he said was improved since the fall and fracture. He is getting MS Contin BID and Dilaudid 1 mg ever 3 hours for pain. He had a PT evaluation today and was able to rise from the bed and walk 4 feet with a rolling walker with a recommendation of continued PT at discharge. His goal is to return to [facility] facility at discharge.  Vital Signs: BP 107/81, HR 121, Resp 20, Temp 98.6, O2 sat 97% on 5 Liters.  I&O: 411ml/510ml  Abnormal labs: Chloride: 96 (L) CO2: 35 (H) Creatinine: 0.43 (L) Calcium: 7.7 (L) RBC: 2.78 (L) Hemoglobin: 7.8 (L) HCT: 24.8 (L) Platelets: 135 (L)  Diagnostics: CT Head IMPRESSION: Motion degraded examination. No evidence of acute intracranial abnormality. Trace right mastoid effusion.  CXR: IMPRESSION: No active disease.  IV/PRN Meds: 0.9 % NaCl with KCl 40 mEq / L infusion Rate: 75 mL/hr Freq: Continuous Route: IV Start: 04/17/20  0845  HYDROmorphone (DILAUDID) injection 1 mg Dose: 1 mg Freq: Every 3 hours PRN Route: IV PRN Reason: severe pain Start: 04/17/20 0959  enoxaparin (LOVENOX) injection 30 mg Dose: 30 mg Freq: Every 24 hours Route: [place] Start: 04/19/20 0800  Problem list: Right hip fracture, s/p fall: s/p intramedullary nail of intertrochanteric right hip fracture on 04/18/2020.  Continue IV Dilaudid and Percocet as needed for pain.  Continue MS Contin for chronic pain. PT and OT evaluation.  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  He was on hospice prior to admission.  End-stage COPD with chronic hypoxemic respiratory failure.  Continue bronchodilators.  Continue 3 L/min oxygen via nasal cannula.  Hypokalemia: Improved  Advanced Crohn's disease: Continue Entocort  Osteoporosis multiple wedge compression fractures of the mid thoracic spine and upper lumbar spine.  Continue analgesics.  Depression and anxiety: Continue psychotropics  Discharge Planning: Ongoing. Patient wants to return to [facility]. Spoke with sister, Angelita Ingles, who has reached out to [employer] to inquire about rehab. They request info to be faxed for review. I have provided this information to the [facility]. Discussed revocation from hospice if rehab route is needed and Angelita Ingles is aware and understands.  Family contact: Spoke with sister, Angelita Ingles, over the phone.  IDG: Updated.  Goals of care: Clear  Please call with any hospice related questions or concerns.  Zandra Abts, RN [facility] Liaison [phone]

## 2020-04-19 NOTE — Progress Notes (Addendum)
Progress Note    Gordon Rubio  VHQ:[id number] DOB: [birth year]  DOA: 04/16/2020 PCP: Einar Crow, MD      Brief Narrative:    Medical records reviewed and are as summarized below:  Gordon Rubio is a 54 y.o. male with medical history significant of advanced centrilobular emphysema and panlobular emphysema with chronic hypoxemia on 3L [place] at home, T Cell lymphoma s/p chemotherapy, moderate-severe protein calorie malnutrition, Crohn's disease, osteoporosis, chronic pain who was on hospice care up until this admission.  He presented to the hospital on 12/4 after a mechanical found to have a  fall found to have an acute comminuted right hip fracture s/p 12/5 intramedullary nail fixation.   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  (Underweight)  Hypotension, resolved - Blood pressure improved after 250 CCs of fluid. - Hold all sedating medicines. - Monitor blood pressure closely. - Check blood cultures.  Hold off on antibiotics for now.  Systolic Heart Failure with 40-45%: He has been on gentle fluids postoperatively and acutely developed respiratory distress. - Patient will need Lasix when BP allows.  Acute Metabolic Encephalopathy, unspecified:  - This is likely due to sedating medications received but will check Head CT to rule out CNS etiology.  Acute Respiratory Distress / Tachypnea - Check CT with PE protocol. - Patient was started on nebs and steroids per Pulmonary, appreciate.  Acute comminuted right hip fracture secondary to mechanical  fall s/p 12/5 intramedullary nail fixation:  - HH is stable. - Hold narcotics due to hypotension and lethargy. - Can give Percocet PRN tonight.  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   - This patient was under hospice care prior to admission.  End-stage COPD with chronic hypoxemic respiratory failure 3L [place].   - Pulmonary started on nebs and steroids, appreciate.  Hypokalemia, resolved  Advanced Crohn's disease - Continue Entocort  Osteoporosis multiple wedge compression fractures of the mid thoracic spine and upper lumbar spine.   - Continue analgesics as tolerated.  Depression and anxiety:  - Continue antipsychotics.  Hold Ativan.    Diet Order            Diet regular Room service appropriate? Yes; Fluid consistency: Thin  Diet effective now                 Consultants:  Pulmonary  Orthopedic Surgery  Procedures:  Intramedullary nail right hip on 04/18/2020   Medications:   . acetaminophen  650 mg Oral Q6H  . azithromycin  250 mg Oral Daily  . budesonide  9 mg Oral q AM  . calcium citrate  400 mg of elemental calcium Oral Daily  . docusate sodium  100 mg Oral BID  . enoxaparin (LOVENOX) injection  30 mg Subcutaneous Q24H  . feeding supplement  237 mL Oral BID BM  . ferrous [id number] C-folic acid  1 capsule Oral BID  . ipratropium-albuterol  3 mL Nebulization Q6H  . methylPREDNISolone (SOLU-MEDROL) injection  40 mg Intravenous Q12H  . mirtazapine  45 mg Oral  QHS  . OLANZapine  10 mg Oral QHS  . OLANZapine  5 mg Oral Daily  . pantoprazole  40 mg Oral Daily  . sertraline  100 mg Oral Daily   Continuous Infusions: . sodium chloride Stopped (04/18/20 1559)  . phenylephrine (NEO-SYNEPHRINE) Adult infusion    . sodium chloride       Anti-infectives (From admission, onward)   Start     Dose/Rate Route Frequency Ordered Stop   04/19/20 1815  azithromycin (ZITHROMAX) tablet 250 mg        250 mg Oral Daily 04/19/20  1717     04/18/20 1500  ceFAZolin (ANCEF) IVPB 1 g/50 mL premix        1 g 100 mL/hr over 30 Minutes Intravenous Every 6 hours 04/18/20 1401 04/19/20 0407   04/18/20 1100  ceFAZolin (ANCEF) IVPB 1 g/50 mL premix        1 g 100 mL/hr over 30 Minutes Intravenous  Once 04/17/20 1238 04/18/20 1026       Family Communication/Anticipated D/C date and plan/Code Status   DVT prophylaxis: enoxaparin (LOVENOX) injection 30 mg Start: 04/19/20 0800 SCDs Start: 04/18/20 1401 SCDs Start: 04/16/20 1653     Code Status: DNR  Family Communication: I discussed plan of care with sister over the phone.  All questions were answered.  She confirmed DNR/DNI status and denies any aggressive management / lines / pressors / etc. Disposition Plan:    Status is: Inpatient  Remains inpatient appropriate because:Unsafe d/c plan and Inpatient level of care appropriate due to severity of illness   Dispo: The patient is from: Home              Anticipated d/c is to: SNF              Anticipated d/c date is: > 3 days              Patient currently is not medically stable to d/c.   Subjective:   Chronically ill appearing male.  Responds to questions appropriately this morning.  In the afternoon, he became hypotensive and drowsy.  He improved with gentle fluids.  He also developed respiratory distress.  We will check Chest CT and monitor closely.  Objective:    Vitals:   04/19/20 0505 04/19/20 0728 04/19/20 1143 04/19/20 1557  BP: 101/86 104/83 107/81 (!) 85/63  Pulse: (!) 127 (!) 109 (!) 121 99  Resp:  19 20 12   Temp: 98.1 F (36.7 C) 98.5 F (36.9 C) 98.6 F (37 C) 97.6 F (36.4 C)  TempSrc:  Oral Oral Oral  SpO2: 100% 98% 97% 100%  Weight:      Height:       No data found.   Intake/Output Summary (Last 24 hours) at 04/19/2020 1727 Last data filed at 04/19/2020 0220 Gross per 24 hour  Intake --  Output 500 ml  Net -500 ml   Filed Weights   04/16/20 1344  Weight: 38.6 kg     Exam:  GEN: NAD SKIN: Warm and dry EYES: No pallor or icterus ENT: MMM CV: RRR, did not appreciate a murmur. PULM: Decreased air entry bilaterally.  No wheezing or crackles. ABD: soft, ND, NT, +BS CNS: AAO x 3, no focal deficits. EXT: No edema or tenderness    Data Reviewed:   I have personally reviewed following labs and imaging studies:  Labs: Labs show the following:   Basic Metabolic Panel: Recent Labs  Lab 04/16/20 1348 04/16/20 1348 04/17/20  [id number] 04/17/20 0337 04/18/20 0549 04/19/20 0428  NA 134*  --  130*  --  135 137  K 2.2*   < > 3.3*   < > 4.0 4.4  CL 79*  --  77*  --  88* 96*  CO2 40*  --  41*  --  36* 35*  GLUCOSE 126*  --  112*  --  96 72  BUN 7  --  7  --  11 11  CREATININE 0.64  --  0.64  --  0.51* 0.43*  CALCIUM 5.4*  --  7.1*  --  7.8* 7.7*  MG 0.6*  --  2.0  --   --   --   PHOS 2.8  --  3.6  --   --   --    < > = values in this interval not displayed.   GFR Estimated Creatinine Clearance: 57 mL/min (A) (by C-G formula based on SCr of 0.43 mg/dL (L)). Liver Function Tests: Recent Labs  Lab 04/16/20 1348  AST 33  ALT 18  ALKPHOS 116  BILITOT 0.7  PROT 5.1*  ALBUMIN 2.1*   Coagulation profile Recent Labs  Lab 04/16/20 1702  INR 1.0    CBC: Recent Labs  Lab 04/16/20 1348 04/17/20 0337 04/18/20 0549 04/19/20 0428 04/19/20 1636  WBC 7.5 9.6 10.1 5.9 9.1  NEUTROABS  --   --  8.9*  --   --   HGB 9.9* 9.0* 9.2* 7.8* 8.6*  HCT 31.7* 29.1* 28.7* 24.8* 28.1*  MCV 87.8 87.1 88.0 89.2 90.6  PLT 175 167 158 135* 180   Sepsis Labs: Recent Labs  Lab 04/17/20 0337 04/18/20 0549 04/19/20 0428 04/19/20 1636  WBC 9.6 10.1 5.9 9.1  LATICACIDVEN  --   --   --  0.8    Microbiology Recent Results (from the past 240 hour(s))  Resp Panel by RT-PCR (Flu A&B, Covid) Nasopharyngeal Swab     Status: None   Collection Time: 04/16/20  2:41 PM   Specimen: Nasopharyngeal Swab; Nasopharyngeal(NP) swabs in vial transport medium  Result  Value Ref Range Status   SARS Coronavirus 2 by RT PCR NEGATIVE NEGATIVE Final    Comment: (NOTE) SARS-CoV-2 target nucleic acids are NOT DETECTED.  The SARS-CoV-2 RNA is generally detectable in upper respiratory specimens during the acute phase of infection. The lowest concentration of SARS-CoV-2 viral copies this assay can detect is 138 copies/mL. A negative result does not preclude SARS-Cov-2 infection and should not be used as the sole basis for treatment or other patient management decisions. A negative result may occur with  improper specimen collection/handling, submission of specimen other than nasopharyngeal swab, presence of viral mutation(s) within the areas targeted by this assay, and inadequate number of viral copies(<138 copies/mL). A negative result must be combined with clinical observations, patient history, and epidemiological information. The expected result is Negative.  Fact Sheet for Patients:  EntrepreneurPulse.com.au  Fact Sheet for Healthcare Providers:  IncredibleEmployment.be  This test is no t yet approved or cleared by the [place] FDA and  has been authorized for detection and/or diagnosis of SARS-CoV-2 by FDA under an Emergency Use Authorization (EUA). This EUA will remain  in effect (meaning this test can be used) for the duration of the COVID-19 declaration under Section 564(b)(1) of the Act, 21 U.S.C.section 360bbb-3(b)(1), unless the authorization is terminated  or revoked sooner.       Influenza A by PCR NEGATIVE NEGATIVE Final   Influenza B by PCR  NEGATIVE NEGATIVE Final    Comment: (NOTE) The Xpert Xpress SARS-CoV-2/FLU/RSV plus assay is intended as an aid in the diagnosis of influenza from Nasopharyngeal swab specimens and should not be used as a sole basis for treatment. Nasal washings and aspirates are unacceptable for Xpert Xpress SARS-CoV-2/FLU/RSV testing.  Fact Sheet for  Patients: EntrepreneurPulse.com.au  Fact Sheet for Healthcare Providers: IncredibleEmployment.be  This test is not yet approved or cleared by the [place] FDA and has been authorized for detection and/or diagnosis of SARS-CoV-2 by FDA under an Emergency Use Authorization (EUA). This EUA will remain in effect (meaning this test can be used) for the duration of the COVID-19 declaration under Section 564(b)(1) of the Act, 21 U.S.C. section 360bbb-3(b)(1), unless the authorization is terminated or revoked.  Performed at [facility], [address] [place], [place] [address]     Procedures and diagnostic studies:  ECHOCARDIOGRAM COMPLETE  Result Date: 04/18/2020    ECHOCARDIOGRAM REPORT   Patient Name:   Gordon Rubio Date of Exam: 04/18/2020 Medical Rec #:  [id number]       Height:       69.0 in Accession #:    [id number]      Weight:       85.0 lb Date of Birth:  June 06, 1965       BSA:          1.436 m Patient Age:    55 years        BP:           110/91 mmHg Patient Gender: M               HR:           108 bpm. Exam Location:  [facility] Procedure: 2D Echo, Color Doppler and Cardiac Doppler Indications:    CHF-acute systolic 680.32  History:        Patient has no prior history of Echocardiogram examinations.                 COPD.  Sonographer:    Sherrie Sport RDCS (AE) Referring Phys: [id number] FUAD ALESKEROV Diagnosing      Kate Sable MD Phys:  Sonographer Comments: Technically difficult study due to poor echo windows, no apical window and suboptimal parasternal window. Image acquisition challenging due to COPD. IMPRESSIONS  1. Left ventricular ejection fraction, by estimation, is 40 to 45%. The left ventricle has mild to moderately decreased function. The left ventricle demonstrates global hypokinesis. Left ventricular diastolic function could not be evaluated.  2. Right ventricular systolic function is low normal. The right ventricular size is  normal.  3. The mitral valve is normal in structure. No evidence of mitral valve regurgitation.  4. The aortic valve was not well visualized. Aortic valve regurgitation is not visualized.  5. The inferior vena cava is dilated in size with >50% respiratory variability, suggesting right atrial pressure of 8 mmHg. FINDINGS  Left Ventricle: Left ventricular ejection fraction, by estimation, is 40 to 45%. The left ventricle has mild to moderately decreased function. The left ventricle demonstrates global hypokinesis. The left ventricular internal cavity size was normal in size. There is no left ventricular hypertrophy. Left ventricular diastolic function could not be evaluated. Right Ventricle: The right ventricular size is normal. No increase in right ventricular wall thickness. Right ventricular systolic function is low normal. Left Atrium: Left atrial size was normal in size. Right Atrium: Right atrial size was normal in size. Pericardium: There is no evidence  of pericardial effusion. Mitral Valve: The mitral valve is normal in structure. No evidence of mitral valve regurgitation. Tricuspid Valve: The tricuspid valve is normal in structure. Tricuspid valve regurgitation is not demonstrated. Aortic Valve: The aortic valve was not well visualized. Aortic valve regurgitation is not visualized. Pulmonic Valve: The pulmonic valve was not well visualized. Pulmonic valve regurgitation is not visualized. Aorta: The aortic root is normal in size and structure. Venous: The inferior vena cava is dilated in size with greater than 50% respiratory variability, suggesting right atrial pressure of 8 mmHg. IAS/Shunts: The interatrial septum was not assessed.  LEFT VENTRICLE PLAX 2D LVIDd:         4.25 cm LVIDs:         3.55 cm LV PW:         1.02 cm LV IVS:        0.76 cm LVOT diam:     2.10 cm LVOT Area:     3.46 cm  LEFT ATRIUM         Index LA diam:    2.20 cm 1.53 cm/m                        PULMONIC VALVE AORTA                  PV Vmax:        0.40 m/s Ao Root diam: 2.50 cm PV Peak grad:   0.6 mmHg                       RVOT Peak grad: 2 mmHg   SHUNTS Systemic Diam: 2.10 cm Kate Sable MD Electronically signed by Kate Sable MD Signature Date/Time: 04/18/2020/1:33:46 PM    Final    DG HIP OPERATIVE UNILAT W OR W/O PELVIS RIGHT  Result Date: 04/18/2020 CLINICAL DATA:  ORIF of right hip fracture 33 seconds. Images: 4 EXAM: OPERATIVE RIGHT HIP (WITH PELVIS IF PERFORMED) 4 VIEWS TECHNIQUE: Fluoroscopic spot image(s) were submitted for interpretation post-operatively. COMPARISON:  None. FINDINGS: A gamma nail and intramedullary rod were placed across the right hip fracture during the study. IMPRESSION: ORIF right hip fracture as above. Electronically Signed   By: Dorise Bullion III M.D   On: 04/18/2020 12:22     LOS: 3 days   George Hugh  [facility]   Pager on [email or web address]. If 7PM-7AM, please contact night-coverage at [email or web address]   04/19/2020, 5:27 PM

## 2020-04-19 NOTE — Progress Notes (Signed)
This patient has received 67mls Omni 350 (type of contrast) contrast extravasation into right forearm (part of body) during a CT PE CHEST exam.  Undisclosed amount of contrast extravasated, approximate 4" area marked off on patient's right forearm, ice pack placed.  The exam was performed on (date) 04/19/20 at 18:20HRS  Site / affected area assessed by [place] and Vanetta Shawl.  A new 20G  IV was placed in left forearm to obtain scan.

## 2020-04-19 NOTE — Progress Notes (Addendum)
   04/19/20 0231  Assess: MEWS Score  Temp 98.6 F (37 C)  BP (!) 85/64  Pulse Rate (!) 113  Resp 16  SpO2 100 %  O2 Device Nasal Cannula  O2 Flow Rate (L/min) 3 L/min  Assess: MEWS Score  MEWS Temp 0  MEWS Systolic 1  MEWS Pulse 2  MEWS RR 0  MEWS LOC 0  MEWS Score 3  MEWS Score Color Yellow  Assess: if the MEWS score is Yellow or Red  Were vital signs taken at a resting state? Yes  Focused Assessment Change from prior assessment (see assessment flowsheet)  Early Detection of Sepsis Score *See Row Information* High  MEWS guidelines implemented *See Row Information* No, previously yellow, continue vital signs every 4 hours  Treat  Pain Scale 0-10  Pain Score 8  Pain Type Surgical pain  Pain Location Hip  Pain Orientation Right  Pain Descriptors / Indicators Aching;Throbbing  Pain Frequency Constant  Pain Intervention(s) Medication (See eMAR)  Notify: Charge Nurse/RN  Name of Charge Nurse/RN Notified Teresa, RN  Date Charge Nurse/RN Notified 04/19/20  Time Charge Nurse/RN Notified 0235  NP B. Randol Kern notified. No new orders. Will continue to monitor to ensure comfort and safety.

## 2020-04-19 NOTE — Progress Notes (Signed)
   Subjective: 1 Day Post-Op Procedure(s) (LRB): INTRAMEDULLARY (IM) NAIL INTERTROCHANTRIC (Right) Patient reports pain as 9 on 0-10 scale.   Patient is well, and has had no acute complaints or problems Denies any CP, SOB, ABD pain. We will start physical therapy today.   Objective: Vital signs in last 24 hours: Temp:  [96.2 F (35.7 C)-98.6 F (37 C)] 98.5 F (36.9 C) (12/06 0728) Pulse Rate:  [89-127] 109 (12/06 0728) Resp:  [14-24] 19 (12/06 0728) BP: (71-127)/(57-94) 104/83 (12/06 0728) SpO2:  [90 %-100 %] 98 % (12/06 0728)  Intake/Output from previous day: 12/05 0701 - 12/06 0700 In: 450.3 [I.V.:400.2; IV Piggyback:50.1] Out: 510 [Stool:500; Blood:10] Intake/Output this shift: No intake/output data recorded.  Recent Labs    04/16/20 1348 04/17/20 0337 04/18/20 0549 04/19/20 0428  HGB 9.9* 9.0* 9.2* 7.8*   Recent Labs    04/18/20 0549 04/19/20 0428  WBC 10.1 5.9  RBC 3.26* 2.78*  HCT 28.7* 24.8*  PLT 158 135*   Recent Labs    04/18/20 0549 04/19/20 0428  NA 135 137  K 4.0 4.4  CL 88* 96*  CO2 36* 35*  BUN 11 11  CREATININE 0.51* 0.43*  GLUCOSE 96 72  CALCIUM 7.8* 7.7*   Recent Labs    04/16/20 1702  INR 1.0    EXAM General - Patient is Alert, Appropriate and Oriented Extremity - Neurovascular intact Sensation intact distally Intact pulses distally Dorsiflexion/Plantar flexion intact No cellulitis present Compartment soft Dressing - dressing C/D/I and no drainage Motor Function - intact, moving foot and toes well on exam.   Past Medical History:  Diagnosis Date  . Cancer ([place])   . Chronic pain syndrome   . COPD (chronic obstructive pulmonary disease) ([place])   . Crohn's disease ([place])   . 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   Weight as of this encounter: 38.6 kg. Advance diet Up with therapy  Pain moderate to severe.  History of chronic pain.  Continue with current pain management Vital signs are stable Acute post op blood loss anemia -hemoglobin 7.8.  Start iron supplement.  Recheck hemoglobin in the morning Care manager to assist with discharge  DVT Prophylaxis - Lovenox, TED hose and SCDs Weight-Bearing as tolerated to right leg   T. Rachelle Hora, PA-C [place] 04/19/2020, 7:59 AM

## 2020-04-20 LAB — BASIC METABOLIC PANEL
Anion gap: 7 (ref 5–15)
BUN: 11 mg/dL (ref 6–20)
CO2: 35 mmol/L — ABNORMAL HIGH (ref 22–32)
Calcium: 8.2 mg/dL — ABNORMAL LOW (ref 8.9–10.3)
Chloride: 95 mmol/L — ABNORMAL LOW (ref 98–111)
Creatinine, Ser: 0.4 mg/dL — ABNORMAL LOW (ref 0.61–1.24)
GFR, Estimated: >60 mL/min (ref >60)
Glucose, Bld: 108 mg/dL — ABNORMAL HIGH (ref 70–99)
Potassium: 5.9 mmol/L — ABNORMAL HIGH (ref 3.5–5.1)
Sodium: 137 mmol/L (ref 135–145)

## 2020-04-20 LAB — CBC
HCT: 24.3 % — ABNORMAL LOW (ref 39.0–52.0)
Hemoglobin: 7.2 g/dL — ABNORMAL LOW (ref 13.0–17.0)
MCH: 26.9 pg (ref 26.0–34.0)
MCHC: 29.6 g/dL — ABNORMAL LOW (ref 30.0–36.0)
MCV: 90.7 fL (ref 80.0–100.0)
Platelets: 144 10*3/uL — ABNORMAL LOW (ref 150–400)
RBC: 2.68 MIL/uL — ABNORMAL LOW (ref 4.22–5.81)
RDW: 13.3 % (ref 11.5–15.5)
WBC: 5.5 10*3/uL (ref 4.0–10.5)
nRBC: 0 % (ref 0.0–0.2)

## 2020-04-20 MED ORDER — INSULIN REGULAR HUMAN 100 UNIT/ML IJ SOLN
5.0000 [IU] | Freq: Once | INTRAMUSCULAR | Status: DC
  Filled 2020-04-20: qty 3

## 2020-04-20 MED ORDER — ENSURE ENLIVE PO LIQD
237.0000 mL | Freq: Three times a day (TID) | ORAL | Status: DC
  Administered 2020-04-21 – 2020-04-22 (×2): 237 mL via ORAL

## 2020-04-20 MED ORDER — CALCIUM GLUCONATE-NACL 2-0.675 GM/100ML-% IV SOLN
2.0000 g | Freq: Once | INTRAVENOUS | Status: AC
  Administered 2020-04-20: 2000 mg via INTRAVENOUS
  Filled 2020-04-20 (×2): qty 100

## 2020-04-20 MED ORDER — OXYCODONE HCL 5 MG PO TABS
15.0000 mg | ORAL_TABLET | ORAL | Status: DC | PRN
  Administered 2020-04-21 – 2020-04-22 (×3): 15 mg via ORAL
  Filled 2020-04-20 (×3): qty 3

## 2020-04-20 MED ORDER — MORPHINE SULFATE ER 30 MG PO TBCR
90.0000 mg | EXTENDED_RELEASE_TABLET | Freq: Two times a day (BID) | ORAL | Status: DC
  Administered 2020-04-20: 90 mg via ORAL
  Filled 2020-04-20: qty 3

## 2020-04-20 MED ORDER — SODIUM CHLORIDE 0.9% IV SOLUTION: Freq: Once | INTRAVENOUS | Status: AC

## 2020-04-20 MED ORDER — SODIUM ZIRCONIUM CYCLOSILICATE 10 G PO PACK
10.0000 g | PACK | Freq: Once | ORAL | Status: AC
  Administered 2020-04-20: 10 g via ORAL
  Filled 2020-04-20: qty 1

## 2020-04-20 MED ORDER — INSULIN REGULAR HUMAN 100 UNIT/ML IJ SOLN: 5.0000 [IU] | Freq: Once | INTRAMUSCULAR | Status: DC

## 2020-04-20 MED ORDER — INSULIN ASPART 100 UNIT/ML IV SOLN
5.0000 [IU] | Freq: Once | INTRAVENOUS | Status: AC
  Administered 2020-04-20: 5 [IU] via INTRAVENOUS
  Filled 2020-04-20: qty 0.05

## 2020-04-20 MED ORDER — MIDODRINE HCL 5 MG PO TABS
5.0000 mg | ORAL_TABLET | Freq: Three times a day (TID) | ORAL | Status: DC
  Administered 2020-04-20 – 2020-04-22 (×6): 5 mg via ORAL
  Filled 2020-04-20 (×7): qty 1

## 2020-04-20 MED ORDER — CHLORHEXIDINE GLUCONATE CLOTH 2 % EX PADS
6.0000 | MEDICATED_PAD | Freq: Every day | CUTANEOUS | Status: DC
  Administered 2020-04-21 – 2020-04-22 (×3): 6 via TOPICAL

## 2020-04-20 MED ORDER — DEXTROSE 50 % IV SOLN: 50.0000 mL | Freq: Once | INTRAVENOUS | Status: DC

## 2020-04-20 MED ORDER — LORAZEPAM 0.5 MG PO TABS: 0.2500 mg | ORAL_TABLET | Freq: Four times a day (QID) | ORAL | Status: DC | PRN

## 2020-04-20 MED ORDER — MORPHINE SULFATE (PF) 2 MG/ML IV SOLN
1.0000 mg | INTRAVENOUS | Status: DC | PRN
  Administered 2020-04-20 – 2020-04-21 (×2): 1 mg via INTRAVENOUS
  Filled 2020-04-20 (×2): qty 1

## 2020-04-20 MED ORDER — DEXTROSE 50 % IV SOLN
50.0000 mL | Freq: Once | INTRAVENOUS | Status: AC
  Administered 2020-04-20: 50 mL via INTRAVENOUS
  Filled 2020-04-20: qty 50

## 2020-04-20 MED ORDER — MORPHINE SULFATE ER 15 MG PO TBCR
15.0000 mg | EXTENDED_RELEASE_TABLET | Freq: Two times a day (BID) | ORAL | Status: DC
  Administered 2020-04-20 – 2020-04-22 (×4): 15 mg via ORAL
  Filled 2020-04-20 (×4): qty 1

## 2020-04-20 MED ORDER — IPRATROPIUM-ALBUTEROL 0.5-2.5 (3) MG/3ML IN SOLN
3.0000 mL | Freq: Three times a day (TID) | RESPIRATORY_TRACT | Status: DC
  Administered 2020-04-20 – 2020-04-22 (×5): 3 mL via RESPIRATORY_TRACT
  Filled 2020-04-20 (×6): qty 3

## 2020-04-20 MED ORDER — LACTATED RINGERS IV BOLUS
250.0000 mL | Freq: Once | INTRAVENOUS | Status: AC
  Administered 2020-04-20: 250 mL via INTRAVENOUS

## 2020-04-20 MED ORDER — ADULT MULTIVITAMIN W/MINERALS CH
1.0000 | ORAL_TABLET | Freq: Every day | ORAL | Status: DC
  Administered 2020-04-21 – 2020-04-22 (×2): 1 via ORAL
  Filled 2020-04-20 (×2): qty 1

## 2020-04-20 NOTE — Progress Notes (Signed)
Patient ID: Gordon Rubio, male   DOB: 07/19/1965, 54 y.o.   MRN: [id number] Pt s/p contrast extravasation to RUE/AC region yesterday during CT . Area in question with marked boundaries, some erythema/edema, no skin breakdown at this time; intact distal pulses; occ paresthesias digits noted; motor fxn ok; rec cont intermittent ice packs to area prn, arm elevation to promote venous drainage; cont to monitor for any new skin or vascular changes

## 2020-04-20 NOTE — Progress Notes (Signed)
Pulmonary Medicine          Date: 04/20/2020,   MRN# [id number] Gordon Rubio 06/14/1965     AdmissionWeight: 38.6 kg                 CurrentWeight: 38.6 kg Referring physician: Dr. Blaine Hamper     CHIEF COMPLAINT:   Advanced centrilobular emphysema with requirement pulmonary for presurgical evaluation   HISTORY OF PRESENT ILLNESS   Gordon Rubio 54 year old male with centrilobular and panlobular emphysema chronic hypoxemia with 3 L O2 requirement at baseline as well as alpha-1 antitrypsin deficiency, history of Crohn's disease, chemotherapy-induced neuropathy, rheumatoid arthritis, osteoporosis, nephrolithiasis and T-cell lymphoma, depression, chronic pain syndrome, protein calorie malnutrition of moderate severity, history of lifelong smoking, chronic diarrhea, is currently with hospice and presents to the hospital after mechanical fall status post right hip fracture.  In the ER he was found to have relatively unimpressive CBC and BMP chest x-ray was done with emphysematous appearing lungs bilaterally however without pulmonary edema pneumothorax or consolidated infiltrate, COVID-19 was negative and vitals relatively stable on home O2 requirement.  Patient was evaluated by orthopedic surgery with discovery of high intratrochanteric hip fracture with likely requiring surgical intervention for repair.  Pulmonary consultation was placed for presurgical evaluation due to complex and advanced pulmonary comorbid history.  -After discussion with patient regarding risk factor and specifically his high risk of post operative respiratory failure patient explains that he has had numerous surgeries in the past and had no issues and this time he is in severe pain and immobilized so he insists that operation be done as he would not wish to live with such severe pain nor inability to ambulate.     04/19/20- patient is s/p surgery, he has transient hypotension and is with labored breathing but he is  lucid and answered all questions accurately on 1st attempt.  He has ABG ordered.  I will oder neb tx and solumedrol , discussed with RT Sullivan Lone. Patient has BIPAP NIV trelegy at home.   Spirometric data 04/17/20  Media Information   Document Information    PAST MEDICAL HISTORY   Past Medical History:  Diagnosis Date  . Cancer ([place])   . Chronic pain syndrome   . COPD (chronic obstructive pulmonary disease) ([place])   . Crohn's disease ([place])   . 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 BOWEL RESECTION    . INTRAMEDULLARY (IM) NAIL INTERTROCHANTERIC Right 04/18/2020   Procedure: INTRAMEDULLARY (IM) NAIL INTERTROCHANTRIC;  Surgeon: Hessie Knows, MD;  Location: [facility] ORS;  Service: Orthopedics;  Laterality: Right;     FAMILY HISTORY   Family History  Problem Relation Age of Onset  . COPD Mother   . Stroke Father      SOCIAL HISTORY   Social History   Tobacco Use  . Smoking status: Former Research scientist (life sciences)  . Smokeless tobacco: Never Used  Substance Use Topics  . Alcohol use: Not Currently  . Drug use: Never     MEDICATIONS    Home Medication:    Current Medication:  Current Facility-Administered Medications:  .  0.9 %  sodium chloride infusion, 250 mL, Intravenous, Continuous, Jennye Boroughs, MD, Paused at 04/18/20 1559 .  acetaminophen (TYLENOL) tablet 650 mg, 650 mg, Oral, Q6H, Masoud, Hannah, MD, 650 mg at 04/20/20 1413 .  albuterol (VENTOLIN HFA) 108 (90 Base) MCG/ACT inhaler 2 puff, 2 puff, Inhalation, Q4H  PRN, Hessie Knows, MD, 2 puff at 04/17/20 1032 .  alum & mag hydroxide-simeth (MAALOX/MYLANTA) 200-200-20 MG/5ML suspension 30 mL, 30 mL, Oral, Q4H PRN, Hessie Knows, MD .  azithromycin [facility]) tablet 250 mg, 250 mg, Oral, Daily, George Hugh, MD, 250 mg at 04/20/20 0826 .  bisacodyl (DULCOLAX) suppository 10 mg, 10 mg, Rectal, Daily PRN, Hessie Knows, MD .  budesonide (ENTOCORT  EC) 24 hr capsule 9 mg, 9 mg, Oral, q AM, Hessie Knows, MD, 9 mg at 04/19/20 0929 .  calcium citrate (CALCITRATE - dosed in mg elemental calcium) tablet 400 mg of elemental calcium, 400 mg of elemental calcium, Oral, Daily, Hessie Knows, MD, 400 mg of elemental calcium at 04/19/20 0916 .  dextromethorphan-guaiFENesin ([place] DM) 30-600 MG per 12 hr tablet 1 tablet, 1 tablet, Oral, BID PRN, Hessie Knows, MD .  docusate sodium (COLACE) capsule 100 mg, 100 mg, Oral, BID, Hessie Knows, MD, 100 mg at 04/19/20 0915 .  enoxaparin (LOVENOX) injection 30 mg, 30 mg, Subcutaneous, Q24H, Hessie Knows, MD, 30 mg at 04/20/20 0902 .  feeding supplement (ENSURE ENLIVE / ENSURE PLUS) liquid 237 mL, 237 mL, Oral, TID BM, Masoud, Hannah, MD .  ferrous [id number] C-folic acid (TRINSICON / FOLTRIN) capsule 1 capsule, 1 capsule, Oral, BID, Duanne Guess, PA-C, 1 capsule at 04/19/20 0915 .  ipratropium-albuterol (DUONEB) 0.5-2.5 (3) MG/3ML nebulizer solution 3 mL, 3 mL, Nebulization, Q6H, Cristiano Capri, MD, 3 mL at 04/20/20 1410 .  LORazepam (ATIVAN) tablet 0.25 mg, 0.25 mg, Oral, Q6H PRN, George Hugh, MD .  magnesium citrate solution 1 Bottle, 1 Bottle, Oral, Once PRN, Hessie Knows, MD .  magnesium hydroxide (MILK OF MAGNESIA) suspension 30 mL, 30 mL, Oral, Daily PRN, Hessie Knows, MD .  menthol-cetylpyridinium (CEPACOL) lozenge 3 mg, 1 lozenge, Oral, PRN **OR** phenol (CHLORASEPTIC) mouth spray 1 spray, 1 spray, Mouth/Throat, PRN, Hessie Knows, MD .  methocarbamol (ROBAXIN) tablet 500 mg, 500 mg, Oral, Q8H PRN, Hessie Knows, MD, 500 mg at 04/19/20 0930 .  methylPREDNISolone sodium succinate (SOLU-MEDROL) 40 mg/mL injection 40 mg, 40 mg, Intravenous, Q12H, Lanney Gins, Corlis Angelica, MD, 40 mg at 04/20/20 0631 .  metoCLOPramide (REGLAN) tablet 5-10 mg, 5-10 mg, Oral, Q8H PRN **OR** metoCLOPramide (REGLAN) injection 5-10 mg, 5-10 mg, Intravenous, Q8H PRN, Hessie Knows, MD .  midodrine (PROAMATINE) tablet 5  mg, 5 mg, Oral, TID WC, Masoud, Hannah, MD .  mirtazapine (REMERON) tablet 45 mg, 45 mg, Oral, QHS, Hessie Knows, MD, 45 mg at 04/18/20 2101 .  morphine (MS CONTIN) 12 hr tablet 15 mg, 15 mg, Oral, Q12H, Masoud, Hannah, MD .  morphine 2 MG/ML injection 1 mg, 1 mg, Intravenous, Q1H PRN, George Hugh, MD, 1 mg at 04/20/20 1238 .  [START ON 04/21/2020] multivitamin with minerals tablet 1 tablet, 1 tablet, Oral, Daily, Masoud, Hannah, MD .  OLANZapine (ZYPREXA) tablet 10 mg, 10 mg, Oral, QHS, Hessie Knows, MD, 10 mg at 04/18/20 2100 .  OLANZapine (ZYPREXA) tablet 5 mg, 5 mg, Oral, Daily, Hessie Knows, MD, 5 mg at 04/18/20 2101 .  ondansetron (ZOFRAN) tablet 4 mg, 4 mg, Oral, Q6H PRN **OR** ondansetron (ZOFRAN) injection 4 mg, 4 mg, Intravenous, Q6H PRN, Hessie Knows, MD .  oxyCODONE (Oxy IR/ROXICODONE) immediate release tablet 15 mg, 15 mg, Oral, Q4H PRN, Hessie Knows, MD .  pantoprazole (PROTONIX) EC tablet 40 mg, 40 mg, Oral, Daily, Hessie Knows, MD, 40 mg at 04/19/20 0930 .  senna-docusate (Senokot-S) tablet 1 tablet, 1 tablet, Oral, QHS PRN, Hessie Knows, MD .  sertraline (ZOLOFT) tablet 100 mg, 100 mg, Oral, Daily, Hessie Knows, MD, 100 mg at 04/18/20 2059    ALLERGIES   Doxycycline, Sulfa antibiotics, Sulfamethoxazole, and Glycopyrrolate-formoterol     REVIEW OF SYSTEMS    Review of Systems:  Gen:  Denies  fever, sweats, chills weigh loss  HEENT: Denies blurred vision, double vision, ear pain, eye pain, hearing loss, nose bleeds, sore throat Cardiac:  No dizziness, chest pain or heaviness, chest tightness,edema Resp:   Denies cough or sputum porduction, shortness of breath,wheezing, hemoptysis,  Gi: Denies swallowing difficulty, stomach pain, nausea or vomiting, diarrhea, constipation, bowel incontinence Gu:  Denies bladder incontinence, burning urine Ext:   Denies Joint pain, stiffness or swelling Skin: Denies  skin rash, easy bruising or bleeding or hives Endoc:   Denies polyuria, polydipsia , polyphagia or weight change Psych:   Denies depression, insomnia or hallucinations   Other: reports pain severe and improved with IV daulaudid, severe hip pain post fracture   VS: BP (!) 87/63 (BP Location: Left Arm)   Pulse 98   Temp (!) 97.5 F (36.4 C) (Oral)   Resp 16   Ht 5\' 9"  (1.753 m)   Wt 38.6 kg   SpO2 95%   BMI 12.55 kg/m      PHYSICAL EXAM    GENERAL:NAD, no fevers, chills, no weakness no fatigue HEAD: Normocephalic, atraumatic.  EYES: Pupils equal, round, reactive to light. Extraocular muscles intact. No scleral icterus.  MOUTH: Moist mucosal membrane. Dentition intact. No abscess noted.  EAR, NOSE, THROAT: Clear without exudates. No external lesions.  NECK: Supple. No thyromegaly. No nodules. No JVD.  PULMONARY: decreased bs bilaterally no wheezing no rhonchi CARDIOVASCULAR: S1 and S2. Regular rate and rhythm. No murmurs, rubs, or gallops. No edema. Pedal pulses 2+ bilaterally.  GASTROINTESTINAL: Soft, nontender, nondistended. No masses. Positive bowel sounds. No hepatosplenomegaly.  MUSCULOSKELETAL:broken r hip with pain and tenderness NEUROLOGIC: Cranial nerves II through XII are intact. No gross focal neurological deficits. Sensation intact. Reflexes intact.  SKIN: No ulceration, lesions, rashes, or cyanosis. Skin warm and dry. Turgor intact.  PSYCHIATRIC: Mood, affect within normal limits. The patient is awake, alert and oriented x 3. Insight, judgment intact.       IMAGING    DG Chest 1 View  Result Date: 04/19/2020 CLINICAL DATA:  Shortness of breath. EXAM: CHEST  1 VIEW COMPARISON:  April 16, 2020. FINDINGS: The heart size and mediastinal contours are within normal limits. Both lungs are clear. No pneumothorax or pleural effusion is noted. Left internal jugular Port-A-Cath is unchanged in position. The visualized skeletal structures are unremarkable. IMPRESSION: No active disease. Electronically Signed   By: Marijo Conception M.D.   On: 04/19/2020 17:43   DG Chest 1 View  Result Date: 04/16/2020 CLINICAL DATA:  Right hip pain after a fall today. EXAM: CHEST  1 VIEW COMPARISON:  None. FINDINGS: A Port-A-Cath terminates over the right atrium. The cardiac silhouette is normal in size. There is mild prominence of the central pulmonary arteries. No airspace consolidation, edema, pleural effusion, or pneumothorax is identified. Surgical clips are noted in the right upper abdomen. No acute osseous abnormality is identified. IMPRESSION: No active disease. Electronically Signed   By: Logan Bores M.D.   On: 04/16/2020 15:05   CT HEAD WO CONTRAST  Result Date: 04/19/2020 CLINICAL DATA:  Delirium. EXAM: CT HEAD WITHOUT CONTRAST TECHNIQUE: Contiguous axial images were obtained from the base of the skull through the vertex without intravenous contrast. COMPARISON:  Prior head CT 04/16/2020. FINDINGS: Brain: Mildly motion degraded examination. There is no acute intracranial hemorrhage. No demarcated cortical infarct. No extra-axial fluid collection. No evidence of intracranial mass. No midline shift. Vascular: No hyperdense vessel. Skull: Normal. Negative for fracture or focal lesion. Sinuses/Orbits: Visualized orbits show no acute finding. Small left maxillary sinus mucous retention cyst. Trace fluid within the bilateral mastoid air cells. IMPRESSION: Mildly motion degraded examination. No evidence of acute intracranial abnormality. Electronically Signed   By: Kellie Simmering DO   On: 04/19/2020 19:46   CT Head Wo Contrast  Result Date: 04/16/2020 CLINICAL DATA:  Head trauma, minor, normal mental status. Additional history provided: Fall. EXAM: CT HEAD WITHOUT CONTRAST TECHNIQUE: Contiguous axial images were obtained from the base of the skull through the vertex without intravenous contrast. COMPARISON:  No pertinent prior exams available for comparison. FINDINGS: Brain: The examination is mildly motion degraded. There is no acute  intracranial hemorrhage. No demarcated cortical infarct. No extra-axial fluid collection. No evidence of intracranial mass. No midline shift. Vascular: No hyperdense vessel. Skull: Normal. Negative for fracture or focal lesion. Sinuses/Orbits: Visualized orbits show no acute finding. Tiny bilateral maxillary sinus mucous retention cyst. Trace right mastoid effusion IMPRESSION: Motion degraded examination. No evidence of acute intracranial abnormality. Trace right mastoid effusion. Electronically Signed   By: Kellie Simmering DO   On: 04/16/2020 14:21   CT ANGIO CHEST PE W OR WO CONTRAST  Addendum Date: 04/19/2020   ADDENDUM REPORT: 04/19/2020 19:49 ADDENDUM: Study discussed by telephone with Provider Sharion Settler on 04/19/2020 at 1933 hours. We discussed surveillance of the right antecubital fossa contrast extravasation site, with a low threshold for consulting Surgery if any alarming skin changes do occur. Electronically Signed   By: Genevie Ann M.D.   On: 04/19/2020 19:49   Result Date: 04/19/2020 CLINICAL DATA:  54 year old male with chest pain and shortness of breath. EXAM: CT ANGIOGRAPHY CHEST WITH CONTRAST TECHNIQUE: Multidetector CT imaging of the chest was performed using the standard protocol during bolus administration of intravenous contrast. Multiplanar CT image reconstructions and MIPs were obtained to evaluate the vascular anatomy. CONTRAST:  Total of 135 mL OMNIPAQUE IOHEXOL 350 MG/ML SOLN, after a portion of the initial 75 mL contrast dose extravasated in the right antecubital fossa. A portion of that contrast extravasation is visible on series 9, image 109 - roughly estimated at 50 mL. I was contacted by the CT technologists regarding this extravasation at 1821 hours. They advised the right AC was swollen and mildly erythematous. I advised application of ice and also elevation of the extremity as possible. Appropriate EMR post extravasation order-set was also placed by the technologists. A 2nd IV  was placed into the left upper extremity and ultimately the study was repeated at 1851 hours. COMPARISON:  Portable chest x-ray earlier today. FINDINGS: Cardiovascular: Ultimately good contrast bolus timing in the pulmonary arterial tree. No focal filling defect identified in the pulmonary arteries to suggest acute pulmonary embolism. No cardiomegaly or pericardial effusion. Left chest power port redemonstrated. Negative visible aorta aside from minor atherosclerosis. Mediastinum/Nodes: Negative.  No mediastinal lymphadenopathy. Lungs/Pleura: Trace bilateral layering pleural fluid. Diffuse centrilobular emphysema. Major airways are patent. There is very mild left lower lobe posterior basal segment peribronchial opacity (series 8, image 66). Mild generalized bronchial wall thickening. No consolidation. No pulmonary nodule identified. Upper Abdomen: Negative visible liver, spleen, pancreas, adrenal glands and kidneys (some excreted IV contrast from the initial bolus noted). Mildly to moderately gas and fluid distended stomach.  Musculoskeletal: Diffuse osteopenia and thoracic spinal compression fractures. The T1 thoracic level is least compressed. No significant thoracic retropulsion. The visible upper lumbar levels L1 through L3 are also compressed. Superimposed occasional chronic appearing rib fractures (right lateral 8th rib). No definite acute osseous abnormality. Partially visible right antecubital fossa contrast extravasation volume on series 9, image 109, roughly estimated at 50 mL. Review of the MIP images confirms the above findings. IMPRESSION: 1. Negative for pulmonary embolus. 2. Right antecubital fossa contrast extravasation as detailed above. 3. Extensive Emphysema (ICD10-J43.9) with mild left lower lobe peribronchial infection suspected. Trace bilateral pleural effusions. 4. Osteopenia with diffuse compression fractures throughout the visible thoracic and lumbar spine. Electronically Signed: By: Genevie Ann  M.D. On: 04/19/2020 19:31   ECHOCARDIOGRAM COMPLETE  Result Date: 04/18/2020    ECHOCARDIOGRAM REPORT   Patient Name:   Gordon Rubio Date of Exam: 04/18/2020 Medical Rec #:  [id number]       Height:       69.0 in Accession #:    [id number]      Weight:       85.0 lb Date of Birth:  03-Sep-1965       BSA:          1.436 m Patient Age:    40 years        BP:           110/91 mmHg Patient Gender: M               HR:           108 bpm. Exam Location:  [facility] Procedure: 2D Echo, Color Doppler and Cardiac Doppler Indications:    CHF-acute systolic 073.71  History:        Patient has no prior history of Echocardiogram examinations.                 COPD.  Sonographer:    Sherrie Sport RDCS (AE) Referring Phys: [id number] [place] Diagnosing      Kate Sable MD Phys:  Sonographer Comments: Technically difficult study due to poor echo windows, no apical window and suboptimal parasternal window. Image acquisition challenging due to COPD. IMPRESSIONS  1. Left ventricular ejection fraction, by estimation, is 40 to 45%. The left ventricle has mild to moderately decreased function. The left ventricle demonstrates global hypokinesis. Left ventricular diastolic function could not be evaluated.  2. Right ventricular systolic function is low normal. The right ventricular size is normal.  3. The mitral valve is normal in structure. No evidence of mitral valve regurgitation.  4. The aortic valve was not well visualized. Aortic valve regurgitation is not visualized.  5. The inferior vena cava is dilated in size with >50% respiratory variability, suggesting right atrial pressure of 8 mmHg. FINDINGS  Left Ventricle: Left ventricular ejection fraction, by estimation, is 40 to 45%. The left ventricle has mild to moderately decreased function. The left ventricle demonstrates global hypokinesis. The left ventricular internal cavity size was normal in size. There is no left ventricular hypertrophy. Left ventricular diastolic function  could not be evaluated. Right Ventricle: The right ventricular size is normal. No increase in right ventricular wall thickness. Right ventricular systolic function is low normal. Left Atrium: Left atrial size was normal in size. Right Atrium: Right atrial size was normal in size. Pericardium: There is no evidence of pericardial effusion. Mitral Valve: The mitral valve is normal in structure. No evidence of mitral valve regurgitation. Tricuspid Valve: The tricuspid valve is normal  in structure. Tricuspid valve regurgitation is not demonstrated. Aortic Valve: The aortic valve was not well visualized. Aortic valve regurgitation is not visualized. Pulmonic Valve: The pulmonic valve was not well visualized. Pulmonic valve regurgitation is not visualized. Aorta: The aortic root is normal in size and structure. Venous: The inferior vena cava is dilated in size with greater than 50% respiratory variability, suggesting right atrial pressure of 8 mmHg. IAS/Shunts: The interatrial septum was not assessed.  LEFT VENTRICLE PLAX 2D LVIDd:         4.25 cm LVIDs:         3.55 cm LV PW:         1.02 cm LV IVS:        0.76 cm LVOT diam:     2.10 cm LVOT Area:     3.46 cm  LEFT ATRIUM         Index LA diam:    2.20 cm 1.53 cm/m                        PULMONIC VALVE AORTA                 PV Vmax:        0.40 m/s Ao Root diam: 2.50 cm PV Peak grad:   0.6 mmHg                       RVOT Peak grad: 2 mmHg   SHUNTS Systemic Diam: 2.10 cm Kate Sable MD Electronically signed by Kate Sable MD Signature Date/Time: 04/18/2020/1:33:46 PM    Final    DG HIP OPERATIVE UNILAT W OR W/O PELVIS RIGHT  Result Date: 04/18/2020 CLINICAL DATA:  ORIF of right hip fracture 33 seconds. Images: 4 EXAM: OPERATIVE RIGHT HIP (WITH PELVIS IF PERFORMED) 4 VIEWS TECHNIQUE: Fluoroscopic spot image(s) were submitted for interpretation post-operatively. COMPARISON:  None. FINDINGS: A gamma nail and intramedullary rod were placed across the right hip  fracture during the study. IMPRESSION: ORIF right hip fracture as above. Electronically Signed   By: Dorise Bullion III M.D   On: 04/18/2020 12:22   DG HIP UNILAT W OR W/O PELVIS 2-3 VIEWS RIGHT  Result Date: 04/16/2020 CLINICAL DATA:  [place] on right hip. EXAM: DG HIP (WITH OR WITHOUT PELVIS) 2-3V RIGHT COMPARISON:  None. FINDINGS: Bones are diffusely demineralized. Comminuted right intertrochanteric hip fracture noted with varus angulation. Pubic rami unremarkable. IMPRESSION: Comminuted right intertrochanteric hip fracture with varus angulation. Electronically Signed   By: Misty Stanley M.D.   On: 04/16/2020 15:05         ASSESSMENT/PLAN    #1Advanced panlobular emphysema with chronic hypoxemia and alpha-1 antitrypsin deficiency Pulmonary status is currently close to baseline -Recommend incentive spirometry and chest physiotherapy throughout hospitalization -Typical COPD care path without exacerbation -04/20/20-reviewed CT chest - there is no infiltrate, but there is bronchial thickening with chronic severe panlobular emphysema - continue with steroids and po zithromax for COPD exacerbation only    #2Presurgical pulmonary evaluation -Comminuted right intertrochanteric hip fracture with varus Angulation. ARISCAT ([place]) preoperative pulmonary risk index in adults-42.1%-high risk for pulmonary intra and post operative complication [place] respiratory failure index:  High risk 10.1% risk of post operative respiratory failure [place]-  Post operative Respiratory Failure Risk Calculator: 27.9% -High risk Postoperative respiratory failure (PRF)  Baseline advanced panlobular emphysema Severe protein calorie malnutrition Pulmonary hypertension per TTE 03/16/2018 -will order repeat study prior to surgery Bronchoscopy 09/19/2017 -  phlegm throughout airways -Spirometry 04/17/20 - FEV1 - 11% predicted >>>>consistent with known history of advanced panlobular emphysema and COPD GOLD stage 4D -  consider pulmonary transplant evaluation  -Overall patient with high pre-test probability of post operative respiratory complications - if surgery is absolutely required patient may require prolonged intubation with difficulty weaning/liberating from mechanical ventilation and may require prolonged hospitalization.      #3Acute comminuted right hip fracture   -Orthopedic surgery on case-appreciate input   -Pulmonary presurgical evaluation & medical optimization in process  -s/p surge POD- 0    Acute blood loss anemia    - this will certainly affect patients respiratory status - would recommend transfusion if hg <7.0 currently its 7.2   Thank you for allowing me to participate in the care of this patient.   Patient/Family are satisfied with care plan and all questions have been answered.   This document was prepared using Dragon voice recognition software and may include unintentional dictation errors.     Ottie Glazier, M.D.  Division of [place]

## 2020-04-20 NOTE — Progress Notes (Addendum)
Progress Note    Gordon Rubio  DUK:[id number] DOB: Oct 06, 1965  DOA: 04/16/2020 PCP: Einar Crow, MD      Brief Narrative:    Medical records reviewed and are as summarized below:  Gordon Rubio is a 54 y.o. male with medical history significant of advanced centrilobular emphysema and panlobular emphysema with chronic hypoxic respiratory failure on 3L [place] at home, aggressive T Cell lymphoma s/p chemotherapy, severe Crohn's disease, severe protein calorie malnutrition, who is a patient under hospice care up until this admission.  He presented to the hospital on 12/4 after a mechanical fall found to have an acute comminuted right hip fracture s/p 12/5 intramedullary nail fixation.  Postoperatively patient's blood pressure has been running low.   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  (Underweight)  Persistent Hypotension - OK to continue sedating medications as he is postop and a hospice patient. - Schedule Midodrine 5 mg TID.  Can increase to 10 mg TID if needed. - Give 250 CC bolus as needed. - Blood Cultures showed no growth.  No need for antibiotics.  Systolic Heart Failure with 40-45%: He has been on gentle fluids for low blood pressure. - Monitor respiratory status. - Give Lasix if BP allows.  Acute Metabolic Encephalopathy,  unspecified: likely due to medications received.  - Head CT showed no acute abnormality. - Mental status is back to baseline.  Acute Respiratory Distress / Tachypnea / End-stage COPD with chronic hypoxemic respiratory failure 3L [place]. - CT of chest showed no PE and mild pleural effusions. - On Day 2 of IV steroids and nebs per Pulmonary appreciate.  Hyperkalemia - Give IV insulin plus dextrose and IV Calcium. - Recheck in am.  Hospice Medications - Start home MS contin 20 mg BID - increase as tolerated. - Can give Ativan 0.25 q6hrs and IV Morphine 1 mg q1hr PRN. - Start bowel regimen.  Left Upper Extremity Edema due to IV site Infiltration: This was noted on CT Scan. - Keep arm wrapped and elevated.  Monitor.  Acute comminuted right hip fracture secondary to mechanical fall s/p 12/5 intramedullary nail fixation:  - HH is stable. - Pain is under control.  Stage IV T-cell lymphoma: - S/P immunotherapy and chemotherapy.    Advanced Crohn's disease - Continue Entocort  Osteoporosis multiple wedge compression fractures of the mid thoracic spine and upper lumbar spine.   - Continue analgesics as tolerated.  Depression and anxiety:  - Continue antipsychotics.   Diet Order            Diet regular Room service appropriate? Yes; Fluid consistency: Thin  Diet effective now                 Consultants:  Pulmonary  Orthopedic Surgery  Procedures:  Intramedullary nail right hip on 04/18/2020   Medications:   . acetaminophen  650 mg Oral Q6H  . azithromycin  250 mg Oral Daily  . budesonide  9 mg Oral q AM  . calcium citrate  400 mg of elemental calcium Oral Daily  . docusate sodium  100 mg Oral BID  . enoxaparin (LOVENOX) injection  30 mg Subcutaneous Q24H  . feeding supplement  237 mL Oral TID BM  . ferrous [id number] C-folic acid  1 capsule Oral BID  . ipratropium-albuterol  3 mL Nebulization Q6H  . methylPREDNISolone (SOLU-MEDROL) injection  40 mg  Intravenous Q12H  . mirtazapine  45 mg Oral QHS  . morphine  15 mg Oral Q12H  . [START ON 04/21/2020] multivitamin with minerals  1 tablet Oral Daily  . OLANZapine  10 mg Oral QHS  . OLANZapine  5 mg Oral Daily  . pantoprazole  40 mg Oral Daily  . sertraline  100 mg Oral Daily   Continuous Infusions: . sodium chloride Stopped (04/18/20 1559)     Anti-infectives (From admission, onward)   Start     Dose/Rate Route Frequency Ordered Stop   04/19/20 1815  azithromycin (ZITHROMAX) tablet 250 mg        250 mg Oral Daily 04/19/20 1717 04/24/20 0959   04/18/20 1500  ceFAZolin (ANCEF) IVPB 1 g/50 mL premix        1 g 100 mL/hr over 30 Minutes Intravenous Every 6 hours 04/18/20 1401 04/19/20 0407   04/18/20 1100  ceFAZolin (ANCEF) IVPB 1 g/50 mL premix        1 g 100 mL/hr over 30 Minutes Intravenous  Once 04/17/20 1238 04/18/20 1026       Family Communication/Anticipated D/C date and plan/Code Status   DVT prophylaxis: enoxaparin (LOVENOX) injection 30 mg Start: 04/19/20 0800 SCDs Start: 04/18/20 1401 SCDs Start: 04/16/20 1653     Code Status: DNR  Family Communication: I discussed plan of care with sister over the phone.  All questions were answered.  She confirmed DNR/DNI status and denies any aggressive management / lines / pressors / etc. Disposition Plan:    Status is: Inpatient  Remains inpatient appropriate because:Unsafe d/c plan and Inpatient level of care appropriate due to severity of illness   Dispo: The patient is from: Home with Hospice services.              Anticipated d/c is to: Hospice.  Hopefully can discharge home with hospice tomorrow instead of to other facility.              Anticipated d/c date is: > 3 days              Patient currently is not medically stable to d/c.   Subjective:   Chronically ill appearing male.  Responds to questions appropriately this morning.  In the afternoon, he became hypotensive and drowsy.  He improved with gentle  fluids.  He also developed respiratory distress.  We will check Chest CT and monitor closely.  Objective:    Vitals:   04/20/20 0700 04/20/20 0848 04/20/20 1122 04/20/20 1551  BP:  113/88 95/76 (!) 87/63  Pulse:  (!) 120 (!) 110 98  Resp:  15 15 16   Temp: 98.1 F (36.7 C) 98.7 F (37.1 C) 98.3 F (36.8 C) (!) 97.5 F (36.4 C)  TempSrc: Oral  Oral Oral  SpO2:  96% 99% 95%  Weight:      Height:       No data found.   Intake/Output Summary (Last 24 hours) at 04/20/2020 1711 Last data filed at 04/20/2020 1633 Gross per  24 hour  Intake 503.37 ml  Output -  Net 503.37 ml   Filed Weights   04/16/20 1344  Weight: 38.6 kg    Exam:  GEN: NAD SKIN: Warm and dry EYES: No pallor or icterus ENT: MMM CV: RRR, did not appreciate a murmur. PULM: Decreased air entry bilaterally.  No wheezing or crackles. ABD: soft, ND, NT, +BS CNS: AAO x 3, no focal deficits. EXT: No edema or tenderness    Data Reviewed:   I have personally reviewed following labs and imaging studies:  Labs: Labs show the following:   Basic Metabolic Panel: Recent Labs  Lab 04/16/20 1348 04/16/20 1348 04/17/20 0337 04/17/20 0337 04/18/20 0549 04/18/20 0549 04/19/20 0428 04/19/20 0428 04/19/20 1636 04/20/20 0538  NA 134*   < > 130*  --  135  --  137  --  138 137  K 2.2*   < > 3.3*   < > 4.0   < > 4.4   < > 5.2* 5.9*  CL 79*   < > 77*  --  88*  --  96*  --  96* 95*  CO2 40*   < > 41*  --  36*  --  35*  --  33* 35*  GLUCOSE 126*   < > 112*  --  96  --  72  --  132* 108*  BUN 7   < > 7  --  11  --  11  --  13 11  CREATININE 0.64   < > 0.64  --  0.51*  --  0.43*  --  0.45* 0.40*  CALCIUM 5.4*   < > 7.1*  --  7.8*  --  7.7*  --  8.0* 8.2*  MG 0.6*  --  2.0  --   --   --   --   --   --   --   PHOS 2.8  --  3.6  --   --   --   --   --   --   --    < > = values in this interval not displayed.   GFR Estimated Creatinine Clearance: 57 mL/min (A) (by C-G formula based on SCr of 0.4 mg/dL (L)).  Liver Function Tests: Recent Labs  Lab 04/16/20 1348 04/19/20 1636  AST 33 26  ALT 18 19  ALKPHOS 116 112  BILITOT 0.7 0.9  PROT 5.1* 5.4*  ALBUMIN 2.1* 2.1*   Coagulation profile Recent Labs  Lab 04/16/20 1702  INR 1.0    CBC: Recent Labs  Lab 04/17/20 0337 04/18/20 0549 04/19/20 0428 04/19/20 1636 04/20/20 0538  WBC 9.6 10.1 5.9 9.1 5.5  NEUTROABS  --  8.9*  --   --   --   HGB 9.0* 9.2* 7.8* 8.6* 7.2*  HCT 29.1* 28.7* 24.8* 28.1* 24.3*  MCV 87.1 88.0 89.2 90.6 90.7  PLT 167 158 135* 180 144*   Sepsis Labs: Recent Labs  Lab 04/18/20 0549 04/19/20 0428 04/19/20 1636 04/19/20 1945 04/20/20 0538  PROCALCITON  --   --   --  0.16  --   WBC 10.1 5.9 9.1  --  5.5  LATICACIDVEN  --   --  0.8 0.7  --     Microbiology Recent Results (from the past 240 hour(s))  Resp Panel by RT-PCR (Flu A&B, Covid) Nasopharyngeal Swab     Status: None   Collection Time: 04/16/20  2:41 PM   Specimen: Nasopharyngeal Swab; Nasopharyngeal(NP) swabs in  vial transport medium  Result Value Ref Range Status   SARS Coronavirus 2 by RT PCR NEGATIVE NEGATIVE Final    Comment: (NOTE) SARS-CoV-2 target nucleic acids are NOT DETECTED.  The SARS-CoV-2 RNA is generally detectable in upper respiratory specimens during the acute phase of infection. The lowest concentration of SARS-CoV-2 viral copies this assay can detect is 138 copies/mL. A negative result does not preclude SARS-Cov-2 infection and should not be used as the sole basis for treatment or other patient management decisions. A negative result may occur with  improper specimen collection/handling, submission of specimen other than nasopharyngeal swab, presence of viral mutation(s) within the areas targeted by this assay, and inadequate number of viral copies(<138 copies/mL). A negative result must be combined with clinical observations, patient history, and epidemiological information. The expected result is Negative.  Fact  Sheet for Patients:  EntrepreneurPulse.com.au  Fact Sheet for Healthcare Providers:  IncredibleEmployment.be  This test is no t yet approved or cleared by the [place] FDA and  has been authorized for detection and/or diagnosis of SARS-CoV-2 by FDA under an Emergency Use Authorization (EUA). This EUA will remain  in effect (meaning this test can be used) for the duration of the COVID-19 declaration under Section 564(b)(1) of the Act, 21 U.S.C.section 360bbb-3(b)(1), unless the authorization is terminated  or revoked sooner.       Influenza A by PCR NEGATIVE NEGATIVE Final   Influenza B by PCR NEGATIVE NEGATIVE Final    Comment: (NOTE) The Xpert Xpress SARS-CoV-2/FLU/RSV plus assay is intended as an aid in the diagnosis of influenza from Nasopharyngeal swab specimens and should not be used as a sole basis for treatment. Nasal washings and aspirates are unacceptable for Xpert Xpress SARS-CoV-2/FLU/RSV testing.  Fact Sheet for Patients: EntrepreneurPulse.com.au  Fact Sheet for Healthcare Providers: IncredibleEmployment.be  This test is not yet approved or cleared by the [place] FDA and has been authorized for detection and/or diagnosis of SARS-CoV-2 by FDA under an Emergency Use Authorization (EUA). This EUA will remain in effect (meaning this test can be used) for the duration of the COVID-19 declaration under Section 564(b)(1) of the Act, 21 U.S.C. section 360bbb-3(b)(1), unless the authorization is terminated or revoked.  Performed at [facility], [address] [place], [place] [address]   CULTURE, BLOOD (ROUTINE X 2) w Reflex to ID Panel     Status: None (Preliminary result)   Collection Time: 04/19/20  4:37 PM   Specimen: BLOOD  Result Value Ref Range Status   Specimen Description BLOOD BLOOD RIGHT HAND  Final   Special Requests   Final    BOTTLES DRAWN AEROBIC AND ANAEROBIC  Blood Culture adequate volume   Culture   Final    NO GROWTH < 24 HOURS Performed at [facility], [address] [place], [place] [address]    Report Status PENDING  Incomplete  CULTURE, BLOOD (ROUTINE X 2) w Reflex to ID Panel     Status: None (Preliminary result)   Collection Time: 04/19/20  4:43 PM   Specimen: BLOOD  Result Value Ref Range Status   Specimen Description BLOOD BLOOD LEFT HAND  Final   Special Requests   Final    BOTTLES DRAWN AEROBIC AND ANAEROBIC Blood Culture adequate volume   Culture   Final    NO GROWTH < 24 HOURS Performed at [facility], [address], [place], [place] [address]    Report Status PENDING  Incomplete    Procedures and diagnostic  studies:  DG Chest 1 View  Result Date: 04/19/2020 CLINICAL DATA:  Shortness of breath. EXAM: CHEST  1 VIEW COMPARISON:  April 16, 2020. FINDINGS: The heart size and mediastinal contours are within normal limits. Both lungs are clear. No pneumothorax or pleural effusion is noted. Left internal jugular Port-A-Cath is unchanged in position. The visualized skeletal structures are unremarkable. IMPRESSION: No active disease. Electronically Signed   By: Marijo Conception M.D.   On: 04/19/2020 17:43   CT HEAD WO CONTRAST  Result Date: 04/19/2020 CLINICAL DATA:  Delirium. EXAM: CT HEAD WITHOUT CONTRAST TECHNIQUE: Contiguous axial images were obtained from the base of the skull through the vertex without intravenous contrast. COMPARISON:  Prior head CT 04/16/2020. FINDINGS: Brain: Mildly motion degraded examination. There is no acute intracranial hemorrhage. No demarcated cortical infarct. No extra-axial fluid collection. No evidence of intracranial mass. No midline shift. Vascular: No hyperdense vessel. Skull: Normal. Negative for fracture or focal lesion. Sinuses/Orbits: Visualized orbits show no acute finding. Small left maxillary sinus mucous retention cyst. Trace fluid within the bilateral mastoid air  cells. IMPRESSION: Mildly motion degraded examination. No evidence of acute intracranial abnormality. Electronically Signed   By: Kellie Simmering DO   On: 04/19/2020 19:46   CT ANGIO CHEST PE W OR WO CONTRAST  Addendum Date: 04/19/2020   ADDENDUM REPORT: 04/19/2020 19:49 ADDENDUM: Study discussed by telephone with Provider Sharion Settler on 04/19/2020 at 1933 hours. We discussed surveillance of the right antecubital fossa contrast extravasation site, with a low threshold for consulting Surgery if any alarming skin changes do occur. Electronically Signed   By: Genevie Ann M.D.   On: 04/19/2020 19:49   Result Date: 04/19/2020 CLINICAL DATA:  54 year old male with chest pain and shortness of breath. EXAM: CT ANGIOGRAPHY CHEST WITH CONTRAST TECHNIQUE: Multidetector CT imaging of the chest was performed using the standard protocol during bolus administration of intravenous contrast. Multiplanar CT image reconstructions and MIPs were obtained to evaluate the vascular anatomy. CONTRAST:  Total of 135 mL OMNIPAQUE IOHEXOL 350 MG/ML SOLN, after a portion of the initial 75 mL contrast dose extravasated in the right antecubital fossa. A portion of that contrast extravasation is visible on series 9, image 109 - roughly estimated at 50 mL. I was contacted by the CT technologists regarding this extravasation at 1821 hours. They advised the right AC was swollen and mildly erythematous. I advised application of ice and also elevation of the extremity as possible. Appropriate EMR post extravasation order-set was also placed by the technologists. A 2nd IV was placed into the left upper extremity and ultimately the study was repeated at 1851 hours. COMPARISON:  Portable chest x-ray earlier today. FINDINGS: Cardiovascular: Ultimately good contrast bolus timing in the pulmonary arterial tree. No focal filling defect identified in the pulmonary arteries to suggest acute pulmonary embolism. No cardiomegaly or pericardial effusion. Left  chest power port redemonstrated. Negative visible aorta aside from minor atherosclerosis. Mediastinum/Nodes: Negative.  No mediastinal lymphadenopathy. Lungs/Pleura: Trace bilateral layering pleural fluid. Diffuse centrilobular emphysema. Major airways are patent. There is very mild left lower lobe posterior basal segment peribronchial opacity (series 8, image 66). Mild generalized bronchial wall thickening. No consolidation. No pulmonary nodule identified. Upper Abdomen: Negative visible liver, spleen, pancreas, adrenal glands and kidneys (some excreted IV contrast from the initial bolus noted). Mildly to moderately gas and fluid distended stomach. Musculoskeletal: Diffuse osteopenia and thoracic spinal compression fractures. The T1 thoracic level is least compressed. No significant thoracic retropulsion. The visible upper lumbar  levels L1 through L3 are also compressed. Superimposed occasional chronic appearing rib fractures (right lateral 8th rib). No definite acute osseous abnormality. Partially visible right antecubital fossa contrast extravasation volume on series 9, image 109, roughly estimated at 50 mL. Review of the MIP images confirms the above findings. IMPRESSION: 1. Negative for pulmonary embolus. 2. Right antecubital fossa contrast extravasation as detailed above. 3. Extensive Emphysema (ICD10-J43.9) with mild left lower lobe peribronchial infection suspected. Trace bilateral pleural effusions. 4. Osteopenia with diffuse compression fractures throughout the visible thoracic and lumbar spine. Electronically Signed: By: Genevie Ann M.D. On: 04/19/2020 19:31     LOS: 4 days   George Hugh  [facility]   Pager on [email or web address]. If 7PM-7AM, please contact night-coverage at [email or web address]   04/20/2020, 5:11 PM

## 2020-04-20 NOTE — Progress Notes (Addendum)
Nutrition Follow-up  DOCUMENTATION CODES:   Severe malnutrition in context of chronic illness, Underweight  INTERVENTION:  Increase to Ensure Enlive po TID, each supplement provides 350 kcal and 20 grams of protein.   Provide Magic cup TID with meals, each supplement provides 290 kcal and 9 grams of protein.  Provide MVI po daily.  Monitor magnesium, potassium, and phosphorus daily for at least 3 days, MD to replete as needed, as pt is at risk for refeeding syndrome given severe malnutrition.  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  New nutrition diagnosis.  GOAL:   Patient will meet greater than or equal to 90% of their needs  Progressing.  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   Past Surgical Hx (obtained from [facility] GI note 12/24/2018): 2000 s/p resection of ileal stricture and ileocecectomy 06/2004 s/p resection of ileal stricture 03/2007 s/p resection of anastomotic stricture 10/2007 s/p cholecystectomy 01/2011 s/p resection of ileum with approximately 170 cm small bowel remaining and temporary ileostomy 09/2011 s/p ileostomy takedown  Patient s/p echo 12/5 that showed LV EF 40-45%.  Met with patient and his sister at bedside. Patient is in a lot of pain today. He reports he was  unable to eat breakfast due to level of pain. Sister reports that patient has had poor PO intake for a while now. She reports he is followed by hospice, has difficulty eating due to breathing difficulties, and also struggles with Crohn's and short bowel syndrome. She reports at breakfast patient may have 2 eggs with cheese and that is the most he will eat all day. At lunch he may take bites of mashed potatoes up to one cup. At dinner he may have bites of yogurt or mashed potatoes. Patient reports he is tolerating Ensure and has been able to drink some today. He is hopeful appetite will improve when pain is under control. Limited NFPE completed as patient is in a lot of pain and did not want RD to assess certain areas.  Patient's sister reports patient's UBW was 160 lbs for most of his adult life. Per review of chart patient was 50.2 kg on 12/24/2018, 50.3 kg on 06/04/2019, 50.5 kg on 01/01/2020, 48.3 kg on 02/26/2020. Patient currently documented to be 85 lbs (38.6 kg) - sister reports this weight was measured in ED. Patient has lost 11.9 kg (23.6% body weight) over the past 4 months, which is significant for time frame. Suspect patient was already severely malnourished even prior to recent weight loss.  Medications reviewed and include: azithromycin, Colace 100 mg BID, Remeron 45 mg QHS, Protonix.  Labs reviewed: Potassium 5.9, Chloride 95, CO2 35, Creatinin e 0.4.  NUTRITION - FOCUSED PHYSICAL EXAM:    Most Recent Value  Orbital Region Severe depletion  Upper Arm Region Severe depletion  Thoracic and Lumbar Region Unable to assess  Buccal Region Severe depletion  Temple Region Severe  depletion  Clavicle Bone Region Severe depletion  Clavicle and Acromion Bone Region Severe depletion  Scapular Bone Region Unable to assess  Dorsal Hand Severe depletion  Patellar Region Unable to assess  Anterior Thigh Region Unable to assess  Posterior Calf Region Unable to assess  Edema (RD Assessment) --   [non-pitting]  Hair Reviewed  Eyes Reviewed  Mouth Reviewed  Skin Reviewed  Nails Reviewed     Diet Order:   Diet Order            Diet regular Room service appropriate? Yes; Fluid consistency: Thin  Diet effective now                EDUCATION NEEDS:   No education needs have been identified at this time  Skin:  Skin Assessment: Reviewed RN Assessment  Last BM:  PTA/unknown  Height:   Ht Readings from Last 1 Encounters:  04/16/20 _0  (1.753 m)   Weight:   Wt Readings from Last 1 Encounters:  04/16/20 38.6 kg   Ideal Body Weight:  72.7 kg  BMI:  Body mass index is 12.55 kg/m.  Estimated Nutritional Needs:   Kcal:  1600-1800  Protein:  75-85 grams  Fluid:  1.5 L/day  Jacklynn Barnacle, MS, RD, LDN Pager number available on Amion

## 2020-04-20 NOTE — Progress Notes (Signed)
PT Cancellation Note  Patient Details Name: Gordon Rubio MRN: [id number] DOB: 09-22-1965   Cancelled Treatment:     PT attempt. 2nd attempt this morning. Pt reports severe pain. Was given pain medication ~ 45 minutes prior. Pt continued to be unwilling to participate in PT at this time. Will continue to follow per POC progressing as able.    Willette Pa 04/20/2020, 10:15 AM

## 2020-04-20 NOTE — Progress Notes (Signed)
PT Cancellation Note  Patient Details Name: Gordon Rubio MRN: [id number] DOB: 1966-02-20   Cancelled Treatment:     PT attempt. Pt was ordered increased pain meds however continues to c/o severe pain 9/10. K+ 5.9 and Hgb 7.2. Will continue to monitor closely and return when pt is willing to participate and more.    Willette Pa 04/20/2020, 3:15 PM

## 2020-04-20 NOTE — Progress Notes (Addendum)
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 Patient recently moved to [facility] on 12.01. Pt states that he fell when was walking and tripped. He hit his head, and injured his right hip. He developed right hip pain, which is constant, sharp, and severe. No loss of consciousness. No unilateral numbness or tingling in his extremities. He was admitted 12.03 with intertrochanteric hip fracture with severe osteopenia. On 12.05 he underwent an  intramedullary nail of intertrochanteric right hip fracture. He is a DNR and this was a related admission per Dr. Gilford Rile with [employer].  Visited patient at bedside. He was resting in bed, awake, alert and states he is in pain. Late yesterday afternoon patient had some hypotension at 83/57. He was given 296mls of normal saline. A CXR and head CT were done and all sedating meds were held.  At this time, MS Contin at 15mg  BID, Ativan 0.25mg  Q 6 hrs PRN, and Morphine 1mg  Q 3 hrs PRN, Oxy IR 15mg  q 4hrs was restarted. During CT patient had contrast extravasation into right forearm. Ice pack was applied. [place] Liaison did notify attending MD that these were not his usual home dosages and she is planning on increasing his medications as tolerated as she monitors his blood pressure and mental status.   Vital Signs: BP 95/76, HR 110, Resp. 15, Temp 98.3, O2 sat 95% on 3 liters [place]  I & O: 542ml/ no output charted   Abnormal labs: Potassium: 5.9 (H) Chloride: 95 (L) CO2: 35 (H) Glucose: 108 (H) Creatinine: 0.40 (L) Calcium: 8.2 (L) RBC: 2.68 (L) Hemoglobin: 7.2 (L) HCT: 24.3 (L) MCHC: 29.6 (L) Platelets: 144 (L)  Diagnostics: CT Chest: IMPRESSION: 1. Negative for pulmonary embolus. 2. Right  antecubital fossa contrast extravasation as detailed above. 3. Extensive Emphysema (ICD10-J43.9) with mild left lower lobe peribronchial infection suspected. Trace bilateral pleural effusions. 4. Osteopenia with diffuse compression fractures throughout the visible thoracic and lumbar spine.  CT Head: IMPRESSION: Mildly motion degraded examination. No evidence of acute intracranial abnormality.  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  (Underweight)  Hypotension, resolved - Blood pressure improved after 250 CCs of fluid. - Hold all sedating medicines. - Monitor blood pressure closely. - Check blood cultures.  Hold off on antibiotics for now.  Systolic Heart Failure with 40-45%: He has been on gentle fluids postoperatively and acutely developed respiratory distress. - Patient will need Lasix when BP allows.  Acute Metabolic Encephalopathy, unspecified:  - This is likely due to sedating medications received but will  check Head CT to rule out CNS etiology.  Acute Respiratory Distress / Tachypnea - Check CT with PE protocol. - Patient was started on nebs and steroids per Pulmonary, appreciate.  Acute comminuted right hip fracture secondary to mechanical fall s/p  12/5 intramedullary nail fixation:  - HH is stable. - Hold narcotics due to hypotension and lethargy. - Can give Percocet PRN tonight.  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   - This patient was under hospice care prior to admission.  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   - Pulmonary started on nebs and steroids, appreciate.  Hypokalemia, resolved  Advanced Crohn's disease - Continue Entocort  Osteoporosis multiple wedge compression fractures of the mid thoracic spine and upper lumbar spine.   - Continue analgesics as tolerated.  Depression and anxiety:  - Continue antipsychotics.  Hold Ativan.   Discharge Planning: Ongoing   Family contact: Spoke with sister, Angelita Ingles, over the phone. Concerns of restarting patient's meds back as ordered were discussed and MD was notified.  IDG: Updated  Goals of Care: Clear  Please call with any hospice related questions or concerns.  Zandra Abts, RN [facility] Liaison [phone]

## 2020-04-20 NOTE — Progress Notes (Signed)
   Subjective: 2 Days Post-Op Procedure(s) (LRB): INTRAMEDULLARY (IM) NAIL INTERTROCHANTRIC (Right) Patient reports pain as 10 on 0-10 scale. Pain meds dc yesterday due to AMS. Mental status back to baseline.   Patient is well, and has had no acute complaints or problems Denies any CP, SOB, ABD pain. We will start physical therapy today.   Objective: Vital signs in last 24 hours: Temp:  [97.6 F (36.4 C)-98.7 F (37.1 C)] 98.7 F (37.1 C) (12/07 0848) Pulse Rate:  [96-121] 120 (12/07 0848) Resp:  [12-24] 15 (12/07 0848) BP: (81-115)/(62-90) 113/88 (12/07 0848) SpO2:  [96 %-100 %] 96 % (12/07 0848)  Intake/Output from previous day: No intake/output data recorded. Intake/Output this shift: No intake/output data recorded.  Recent Labs    04/18/20 0549 04/19/20 0428 04/19/20 1636 04/20/20 0538  HGB 9.2* 7.8* 8.6* 7.2*   Recent Labs    04/19/20 1636 04/20/20 0538  WBC 9.1 5.5  RBC 3.10* 2.68*  HCT 28.1* 24.3*  PLT 180 144*   Recent Labs    04/19/20 1636 04/20/20 0538  NA 138 137  K 5.2* 5.9*  CL 96* 95*  CO2 33* 35*  BUN 13 11  CREATININE 0.45* 0.40*  GLUCOSE 132* 108*  CALCIUM 8.0* 8.2*   No results for input(s): LABPT, INR in the last 72 hours.  EXAM General - Patient is Alert, Appropriate and Oriented Extremity - Neurovascular intact Sensation intact distally Intact pulses distally Dorsiflexion/Plantar flexion intact No cellulitis present Compartment soft Dressing - dressing C/D/I and moderate drainage Motor Function - intact, moving foot and toes well on exam.   Past Medical History:  Diagnosis Date  . Cancer ([place])   . Chronic pain syndrome   . COPD (chronic obstructive pulmonary disease) ([place])   . Crohn's disease ([place])   . 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   Weight as of this encounter: 38.6 kg. Advance diet Up with therapy  Acute post op blood loss anemia with underlying chronic anemia - Hgb 7.2. Recheck CBC in the am, may require transfusion if Hgb <7.  Pain severe. Pain medications held yesterday due to AMS. Mental status back to baseline. Baseline medications of Morphine ER 90 mg BID restarted.  History of chronic pain.   Care manager to assist with discharge to [facility]   Patient to have staples removed and steri strips applied on 05/02/2020.  Follow up with [place] ortho in 6 weeks for xray of Right femur Lovenox 30 mg [place] daily x 14 days at discharge TED hose BLE x 6 weeks  DVT Prophylaxis - Lovenox, TED hose and SCDs Weight-Bearing as tolerated to right leg   T. Rachelle Hora, PA-C [place] 04/20/2020, 11:04 AM

## 2020-04-20 NOTE — Progress Notes (Signed)
Pts BP 87/63. MD notified, no orders received

## 2020-04-21 LAB — TYPE AND SCREEN
ABO/RH(D): A POS
ABO/RH(D): A POS
Antibody Screen: NEGATIVE
Antibody Screen: NEGATIVE
Unit division: 0
Unit division: 0

## 2020-04-21 LAB — CBC
HCT: 27.6 % — ABNORMAL LOW (ref 39.0–52.0)
Hemoglobin: 8.9 g/dL — ABNORMAL LOW (ref 13.0–17.0)
MCH: 28.6 pg (ref 26.0–34.0)
MCHC: 32.2 g/dL (ref 30.0–36.0)
MCV: 88.7 fL (ref 80.0–100.0)
Platelets: 150 10*3/uL (ref 150–400)
RBC: 3.11 MIL/uL — ABNORMAL LOW (ref 4.22–5.81)
RDW: 13.8 % (ref 11.5–15.5)
WBC: 4 10*3/uL (ref 4.0–10.5)
nRBC: 0 % (ref 0.0–0.2)

## 2020-04-21 LAB — PREPARE RBC (CROSSMATCH)

## 2020-04-21 LAB — BASIC METABOLIC PANEL
Anion gap: 5 (ref 5–15)
BUN: 12 mg/dL (ref 6–20)
CO2: 38 mmol/L — ABNORMAL HIGH (ref 22–32)
Calcium: 8 mg/dL — ABNORMAL LOW (ref 8.9–10.3)
Chloride: 94 mmol/L — ABNORMAL LOW (ref 98–111)
Creatinine, Ser: 0.36 mg/dL — ABNORMAL LOW (ref 0.61–1.24)
GFR, Estimated: >60 mL/min (ref >60)
Glucose, Bld: 97 mg/dL (ref 70–99)
Potassium: 4.9 mmol/L (ref 3.5–5.1)
Sodium: 137 mmol/L (ref 135–145)

## 2020-04-21 LAB — BPAM RBC
Blood Product Expiration Date: 202201022359
Blood Product Expiration Date: 202201022359
ISSUE DATE / TIME: 202112072146
ISSUE DATE / TIME: 202112072146
Unit Type and Rh: 6200
Unit Type and Rh: 6200

## 2020-04-21 LAB — PHOSPHORUS: Phosphorus: 3.3 mg/dL (ref 2.5–4.6)

## 2020-04-21 LAB — MAGNESIUM: Magnesium: 1.4 mg/dL — ABNORMAL LOW (ref 1.7–2.4)

## 2020-04-21 MED ORDER — METHYLPREDNISOLONE SODIUM SUCC 40 MG IJ SOLR: 40.0000 mg | INTRAMUSCULAR | Status: DC

## 2020-04-21 MED ORDER — MAGNESIUM SULFATE 2 GM/50ML IV SOLN
2.0000 g | Freq: Once | INTRAVENOUS | Status: AC
  Administered 2020-04-21: 2 g via INTRAVENOUS
  Filled 2020-04-21: qty 50

## 2020-04-21 NOTE — Progress Notes (Signed)
Pulmonary Medicine          Date: 04/21/2020,   MRN# [id number] Gordon Rubio 1966-01-18     AdmissionWeight: 38.6 kg                 CurrentWeight: 38.6 kg Referring physician: Dr. Blaine Hamper     CHIEF COMPLAINT:   Advanced centrilobular emphysema with requirement pulmonary for presurgical evaluation   HISTORY OF PRESENT ILLNESS   Pleasant 54 year old male with centrilobular and panlobular emphysema chronic hypoxemia with 3 L O2 requirement at baseline as well as alpha-1 antitrypsin deficiency, history of Crohn's disease, chemotherapy-induced neuropathy, rheumatoid arthritis, osteoporosis, nephrolithiasis and T-cell lymphoma, depression, chronic pain syndrome, protein calorie malnutrition of moderate severity, history of lifelong smoking, chronic diarrhea, is currently with hospice and presents to the hospital after mechanical fall status post right hip fracture.  In the ER he was found to have relatively unimpressive CBC and BMP chest x-ray was done with emphysematous appearing lungs bilaterally however without pulmonary edema pneumothorax or consolidated infiltrate, COVID-19 was negative and vitals relatively stable on home O2 requirement.  Patient was evaluated by orthopedic surgery with discovery of high intratrochanteric hip fracture with likely requiring surgical intervention for repair.  Pulmonary consultation was placed for presurgical evaluation due to complex and advanced pulmonary comorbid history.  -After discussion with patient regarding risk factor and specifically his high risk of post operative respiratory failure patient explains that he has had numerous surgeries in the past and had no issues and this time he is in severe pain and immobilized so he insists that operation be done as he would not wish to live with such severe pain nor inability to ambulate.     04/19/20- patient is s/p surgery, he has transient hypotension and is with labored breathing but he is  lucid and answered all questions accurately on 1st attempt.  He has ABG ordered.  I will oder neb tx and solumedrol , discussed with RT Sullivan Lone. Patient has BIPAP NIV trelegy at home.   04/21/20- patient is improved he is more alert and is able to take couple of steps to chair and sat OOB.  He is going to [employer].   Spirometric data 04/17/20  Media Information   Document Information    PAST MEDICAL HISTORY   Past Medical History:  Diagnosis Date  . Cancer ([place])   . Chronic pain syndrome   . COPD (chronic obstructive pulmonary disease) ([place])   . Crohn's disease ([place])   . 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 BOWEL RESECTION    . INTRAMEDULLARY (IM) NAIL INTERTROCHANTERIC Right 04/18/2020   Procedure: INTRAMEDULLARY (IM) NAIL INTERTROCHANTRIC;  Surgeon: Hessie Knows, MD;  Location: [facility] ORS;  Service: Orthopedics;  Laterality: Right;     FAMILY HISTORY   Family History  Problem Relation Age of Onset  . COPD Mother   . Stroke Father      SOCIAL HISTORY   Social History   Tobacco Use  . Smoking status: Former Research scientist (life sciences)  . Smokeless tobacco: Never Used  Substance Use Topics  . Alcohol use: Not Currently  . Drug use: Never     MEDICATIONS    Home Medication:    Current Medication:  Current Facility-Administered Medications:  .  0.9 %  sodium chloride infusion, 250 mL, Intravenous, Continuous, Jennye Boroughs, MD, Paused at 04/18/20 1559 .  acetaminophen (TYLENOL)  tablet 650 mg, 650 mg, Oral, Q6H, Masoud, Jarrett Soho, MD, 650 mg at 04/21/20 1424 .  albuterol (VENTOLIN HFA) 108 (90 Base) MCG/ACT inhaler 2 puff, 2 puff, Inhalation, Q4H PRN, Hessie Knows, MD, 2 puff at 04/17/20 1032 .  alum & mag hydroxide-simeth (MAALOX/MYLANTA) 200-200-20 MG/5ML suspension 30 mL, 30 mL, Oral, Q4H PRN, Hessie Knows, MD .  azithromycin [facility]) tablet 250 mg, 250 mg, Oral, Daily, Masoud, Jarrett Soho,  MD, 250 mg at 04/21/20 0803 .  bisacodyl (DULCOLAX) suppository 10 mg, 10 mg, Rectal, Daily PRN, Hessie Knows, MD .  budesonide (ENTOCORT EC) 24 hr capsule 9 mg, 9 mg, Oral, q AM, Hessie Knows, MD, 9 mg at 04/21/20 0603 .  calcium citrate (CALCITRATE - dosed in mg elemental calcium) tablet 400 mg of elemental calcium, 400 mg of elemental calcium, Oral, Daily, Hessie Knows, MD, 400 mg of elemental calcium at 04/21/20 0803 .  Chlorhexidine Gluconate Cloth 2 % PADS 6 each, 6 each, Topical, Daily, George Hugh, MD, 6 each at 04/21/20 0804 .  dextromethorphan-guaiFENesin (Barron DM) 30-600 MG per 12 hr tablet 1 tablet, 1 tablet, Oral, BID PRN, Hessie Knows, MD .  docusate sodium (COLACE) capsule 100 mg, 100 mg, Oral, BID, Hessie Knows, MD, 100 mg at 04/19/20 0915 .  enoxaparin (LOVENOX) injection 30 mg, 30 mg, Subcutaneous, Q24H, Hessie Knows, MD, 30 mg at 04/21/20 0804 .  feeding supplement (ENSURE ENLIVE / ENSURE PLUS) liquid 237 mL, 237 mL, Oral, TID BM, Masoud, Hannah, MD .  ferrous [id number] C-folic acid (TRINSICON / FOLTRIN) capsule 1 capsule, 1 capsule, Oral, BID, Duanne Guess, PA-C, 1 capsule at 04/21/20 0804 .  ipratropium-albuterol (DUONEB) 0.5-2.5 (3) MG/3ML nebulizer solution 3 mL, 3 mL, Nebulization, TID, Lanney Gins, Omero Kowal, MD, 3 mL at 04/21/20 1442 .  LORazepam (ATIVAN) tablet 0.25 mg, 0.25 mg, Oral, Q6H PRN, George Hugh, MD .  magnesium citrate solution 1 Bottle, 1 Bottle, Oral, Once PRN, Hessie Knows, MD .  magnesium hydroxide (MILK OF MAGNESIA) suspension 30 mL, 30 mL, Oral, Daily PRN, Hessie Knows, MD .  menthol-cetylpyridinium (CEPACOL) lozenge 3 mg, 1 lozenge, Oral, PRN **OR** phenol (CHLORASEPTIC) mouth spray 1 spray, 1 spray, Mouth/Throat, PRN, Hessie Knows, MD .  methocarbamol (ROBAXIN) tablet 500 mg, 500 mg, Oral, Q8H PRN, Hessie Knows, MD, 500 mg at 04/19/20 0930 .  methylPREDNISolone sodium succinate (SOLU-MEDROL) 40 mg/mL injection 40 mg, 40 mg,  Intravenous, Q12H, Lanney Gins, Mandi Mattioli, MD, 40 mg at 04/21/20 0603 .  metoCLOPramide (REGLAN) tablet 5-10 mg, 5-10 mg, Oral, Q8H PRN **OR** metoCLOPramide (REGLAN) injection 5-10 mg, 5-10 mg, Intravenous, Q8H PRN, Hessie Knows, MD .  midodrine (PROAMATINE) tablet 5 mg, 5 mg, Oral, TID WC, George Hugh, MD, 5 mg at 04/21/20 1132 .  mirtazapine (REMERON) tablet 45 mg, 45 mg, Oral, QHS, Hessie Knows, MD, 45 mg at 04/20/20 2058 .  morphine (MS CONTIN) 12 hr tablet 15 mg, 15 mg, Oral, Q12H, George Hugh, MD, 15 mg at 04/21/20 0803 .  morphine 2 MG/ML injection 1 mg, 1 mg, Intravenous, Q1H PRN, George Hugh, MD, 1 mg at 04/21/20 1025 .  multivitamin with minerals tablet 1 tablet, 1 tablet, Oral, Daily, George Hugh, MD, 1 tablet at 04/21/20 0803 .  OLANZapine (ZYPREXA) tablet 10 mg, 10 mg, Oral, QHS, Hessie Knows, MD, 10 mg at 04/20/20 2058 .  OLANZapine (ZYPREXA) tablet 5 mg, 5 mg, Oral, Daily, Hessie Knows, MD, 5 mg at 04/20/20 2100 .  ondansetron (ZOFRAN) tablet 4 mg, 4 mg, Oral, Q6H PRN **OR** ondansetron (  ZOFRAN) injection 4 mg, 4 mg, Intravenous, Q6H PRN, Hessie Knows, MD .  oxyCODONE (Oxy IR/ROXICODONE) immediate release tablet 15 mg, 15 mg, Oral, Q4H PRN, Hessie Knows, MD .  pantoprazole (PROTONIX) EC tablet 40 mg, 40 mg, Oral, Daily, Hessie Knows, MD, 40 mg at 04/21/20 0803 .  senna-docusate (Senokot-S) tablet 1 tablet, 1 tablet, Oral, QHS PRN, Hessie Knows, MD .  sertraline (ZOLOFT) tablet 100 mg, 100 mg, Oral, Daily, Hessie Knows, MD, 100 mg at 04/20/20 2057    ALLERGIES   Doxycycline, Sulfa antibiotics, Sulfamethoxazole, and Glycopyrrolate-formoterol     REVIEW OF SYSTEMS    Review of Systems:  Gen:  Denies  fever, sweats, chills weigh loss  HEENT: Denies blurred vision, double vision, ear pain, eye pain, hearing loss, nose bleeds, sore throat Cardiac:  No dizziness, chest pain or heaviness, chest tightness,edema Resp:   Denies cough or sputum porduction, shortness  of breath,wheezing, hemoptysis,  Gi: Denies swallowing difficulty, stomach pain, nausea or vomiting, diarrhea, constipation, bowel incontinence Gu:  Denies bladder incontinence, burning urine Ext:   Denies Joint pain, stiffness or swelling Skin: Denies  skin rash, easy bruising or bleeding or hives Endoc:  Denies polyuria, polydipsia , polyphagia or weight change Psych:   Denies depression, insomnia or hallucinations   Other: reports pain severe and improved with IV daulaudid, severe hip pain post fracture   VS: BP 94/66 (BP Location: Left Arm)   Pulse 100   Temp 98.6 F (37 C) (Oral)   Resp 17   Ht 5\' 9"  (1.753 m)   Wt 38.6 kg   SpO2 97%   BMI 12.55 kg/m      PHYSICAL EXAM    GENERAL:NAD, no fevers, chills, no weakness no fatigue HEAD: Normocephalic, atraumatic.  EYES: Pupils equal, round, reactive to light. Extraocular muscles intact. No scleral icterus.  MOUTH: Moist mucosal membrane. Dentition intact. No abscess noted.  EAR, NOSE, THROAT: Clear without exudates. No external lesions.  NECK: Supple. No thyromegaly. No nodules. No JVD.  PULMONARY: decreased bs bilaterally no wheezing no rhonchi CARDIOVASCULAR: S1 and S2. Regular rate and rhythm. No murmurs, rubs, or gallops. No edema. Pedal pulses 2+ bilaterally.  GASTROINTESTINAL: Soft, nontender, nondistended. No masses. Positive bowel sounds. No hepatosplenomegaly.  MUSCULOSKELETAL:broken r hip with pain and tenderness NEUROLOGIC: Cranial nerves II through XII are intact. No gross focal neurological deficits. Sensation intact. Reflexes intact.  SKIN: No ulceration, lesions, rashes, or cyanosis. Skin warm and dry. Turgor intact.  PSYCHIATRIC: Mood, affect within normal limits. The patient is awake, alert and oriented x 3. Insight, judgment intact.       IMAGING    DG Chest 1 View  Result Date: 04/19/2020 CLINICAL DATA:  Shortness of breath. EXAM: CHEST  1 VIEW COMPARISON:  April 16, 2020. FINDINGS: The heart size  and mediastinal contours are within normal limits. Both lungs are clear. No pneumothorax or pleural effusion is noted. Left internal jugular Port-A-Cath is unchanged in position. The visualized skeletal structures are unremarkable. IMPRESSION: No active disease. Electronically Signed   By: Marijo Conception M.D.   On: 04/19/2020 17:43   DG Chest 1 View  Result Date: 04/16/2020 CLINICAL DATA:  Right hip pain after a fall today. EXAM: CHEST  1 VIEW COMPARISON:  None. FINDINGS: A Port-A-Cath terminates over the right atrium. The cardiac silhouette is normal in size. There is mild prominence of the central pulmonary arteries. No airspace consolidation, edema, pleural effusion, or pneumothorax is identified. Surgical clips are noted in the  right upper abdomen. No acute osseous abnormality is identified. IMPRESSION: No active disease. Electronically Signed   By: Logan Bores M.D.   On: 04/16/2020 15:05   CT HEAD WO CONTRAST  Result Date: 04/19/2020 CLINICAL DATA:  Delirium. EXAM: CT HEAD WITHOUT CONTRAST TECHNIQUE: Contiguous axial images were obtained from the base of the skull through the vertex without intravenous contrast. COMPARISON:  Prior head CT 04/16/2020. FINDINGS: Brain: Mildly motion degraded examination. There is no acute intracranial hemorrhage. No demarcated cortical infarct. No extra-axial fluid collection. No evidence of intracranial mass. No midline shift. Vascular: No hyperdense vessel. Skull: Normal. Negative for fracture or focal lesion. Sinuses/Orbits: Visualized orbits show no acute finding. Small left maxillary sinus mucous retention cyst. Trace fluid within the bilateral mastoid air cells. IMPRESSION: Mildly motion degraded examination. No evidence of acute intracranial abnormality. Electronically Signed   By: Kellie Simmering DO   On: 04/19/2020 19:46   CT Head Wo Contrast  Result Date: 04/16/2020 CLINICAL DATA:  Head trauma, minor, normal mental status. Additional history provided: Fall.  EXAM: CT HEAD WITHOUT CONTRAST TECHNIQUE: Contiguous axial images were obtained from the base of the skull through the vertex without intravenous contrast. COMPARISON:  No pertinent prior exams available for comparison. FINDINGS: Brain: The examination is mildly motion degraded. There is no acute intracranial hemorrhage. No demarcated cortical infarct. No extra-axial fluid collection. No evidence of intracranial mass. No midline shift. Vascular: No hyperdense vessel. Skull: Normal. Negative for fracture or focal lesion. Sinuses/Orbits: Visualized orbits show no acute finding. Tiny bilateral maxillary sinus mucous retention cyst. Trace right mastoid effusion IMPRESSION: Motion degraded examination. No evidence of acute intracranial abnormality. Trace right mastoid effusion. Electronically Signed   By: Kellie Simmering DO   On: 04/16/2020 14:21   CT ANGIO CHEST PE W OR WO CONTRAST  Addendum Date: 04/19/2020   ADDENDUM REPORT: 04/19/2020 19:49 ADDENDUM: Study discussed by telephone with Provider Sharion Settler on 04/19/2020 at 1933 hours. We discussed surveillance of the right antecubital fossa contrast extravasation site, with a low threshold for consulting Surgery if any alarming skin changes do occur. Electronically Signed   By: Genevie Ann M.D.   On: 04/19/2020 19:49   Result Date: 04/19/2020 CLINICAL DATA:  54 year old male with chest pain and shortness of breath. EXAM: CT ANGIOGRAPHY CHEST WITH CONTRAST TECHNIQUE: Multidetector CT imaging of the chest was performed using the standard protocol during bolus administration of intravenous contrast. Multiplanar CT image reconstructions and MIPs were obtained to evaluate the vascular anatomy. CONTRAST:  Total of 135 mL OMNIPAQUE IOHEXOL 350 MG/ML SOLN, after a portion of the initial 75 mL contrast dose extravasated in the right antecubital fossa. A portion of that contrast extravasation is visible on series 9, image 109 - roughly estimated at 50 mL. I was contacted by  the CT technologists regarding this extravasation at 1821 hours. They advised the right AC was swollen and mildly erythematous. I advised application of ice and also elevation of the extremity as possible. Appropriate EMR post extravasation order-set was also placed by the technologists. A 2nd IV was placed into the left upper extremity and ultimately the study was repeated at 1851 hours. COMPARISON:  Portable chest x-ray earlier today. FINDINGS: Cardiovascular: Ultimately good contrast bolus timing in the pulmonary arterial tree. No focal filling defect identified in the pulmonary arteries to suggest acute pulmonary embolism. No cardiomegaly or pericardial effusion. Left chest power port redemonstrated. Negative visible aorta aside from minor atherosclerosis. Mediastinum/Nodes: Negative.  No mediastinal lymphadenopathy. Lungs/Pleura: Trace  bilateral layering pleural fluid. Diffuse centrilobular emphysema. Major airways are patent. There is very mild left lower lobe posterior basal segment peribronchial opacity (series 8, image 66). Mild generalized bronchial wall thickening. No consolidation. No pulmonary nodule identified. Upper Abdomen: Negative visible liver, spleen, pancreas, adrenal glands and kidneys (some excreted IV contrast from the initial bolus noted). Mildly to moderately gas and fluid distended stomach. Musculoskeletal: Diffuse osteopenia and thoracic spinal compression fractures. The T1 thoracic level is least compressed. No significant thoracic retropulsion. The visible upper lumbar levels L1 through L3 are also compressed. Superimposed occasional chronic appearing rib fractures (right lateral 8th rib). No definite acute osseous abnormality. Partially visible right antecubital fossa contrast extravasation volume on series 9, image 109, roughly estimated at 50 mL. Review of the MIP images confirms the above findings. IMPRESSION: 1. Negative for pulmonary embolus. 2. Right antecubital fossa contrast  extravasation as detailed above. 3. Extensive Emphysema (ICD10-J43.9) with mild left lower lobe peribronchial infection suspected. Trace bilateral pleural effusions. 4. Osteopenia with diffuse compression fractures throughout the visible thoracic and lumbar spine. Electronically Signed: By: Genevie Ann M.D. On: 04/19/2020 19:31   ECHOCARDIOGRAM COMPLETE  Result Date: 04/18/2020    ECHOCARDIOGRAM REPORT   Patient Name:   RED MANDT Date of Exam: 04/18/2020 Medical Rec #:  [id number]       Height:       69.0 in Accession #:    [id number]      Weight:       85.0 lb Date of Birth:  Dec 27, 1965       BSA:          1.436 m Patient Age:    36 years        BP:           110/91 mmHg Patient Gender: M               HR:           108 bpm. Exam Location:  [facility] Procedure: 2D Echo, Color Doppler and Cardiac Doppler Indications:    CHF-acute systolic 710.62  History:        Patient has no prior history of Echocardiogram examinations.                 COPD.  Sonographer:    Sherrie Sport RDCS (AE) Referring Phys: [id number] Zan Orlick Diagnosing      Kate Sable MD Phys:  Sonographer Comments: Technically difficult study due to poor echo windows, no apical window and suboptimal parasternal window. Image acquisition challenging due to COPD. IMPRESSIONS  1. Left ventricular ejection fraction, by estimation, is 40 to 45%. The left ventricle has mild to moderately decreased function. The left ventricle demonstrates global hypokinesis. Left ventricular diastolic function could not be evaluated.  2. Right ventricular systolic function is low normal. The right ventricular size is normal.  3. The mitral valve is normal in structure. No evidence of mitral valve regurgitation.  4. The aortic valve was not well visualized. Aortic valve regurgitation is not visualized.  5. The inferior vena cava is dilated in size with >50% respiratory variability, suggesting right atrial pressure of 8 mmHg. FINDINGS  Left Ventricle: Left ventricular  ejection fraction, by estimation, is 40 to 45%. The left ventricle has mild to moderately decreased function. The left ventricle demonstrates global hypokinesis. The left ventricular internal cavity size was normal in size. There is no left ventricular hypertrophy. Left ventricular diastolic function could not be evaluated. Right Ventricle: The right  ventricular size is normal. No increase in right ventricular wall thickness. Right ventricular systolic function is low normal. Left Atrium: Left atrial size was normal in size. Right Atrium: Right atrial size was normal in size. Pericardium: There is no evidence of pericardial effusion. Mitral Valve: The mitral valve is normal in structure. No evidence of mitral valve regurgitation. Tricuspid Valve: The tricuspid valve is normal in structure. Tricuspid valve regurgitation is not demonstrated. Aortic Valve: The aortic valve was not well visualized. Aortic valve regurgitation is not visualized. Pulmonic Valve: The pulmonic valve was not well visualized. Pulmonic valve regurgitation is not visualized. Aorta: The aortic root is normal in size and structure. Venous: The inferior vena cava is dilated in size with greater than 50% respiratory variability, suggesting right atrial pressure of 8 mmHg. IAS/Shunts: The interatrial septum was not assessed.  LEFT VENTRICLE PLAX 2D LVIDd:         4.25 cm LVIDs:         3.55 cm LV PW:         1.02 cm LV IVS:        0.76 cm LVOT diam:     2.10 cm LVOT Area:     3.46 cm  LEFT ATRIUM         Index LA diam:    2.20 cm 1.53 cm/m                        PULMONIC VALVE AORTA                 PV Vmax:        0.40 m/s Ao Root diam: 2.50 cm PV Peak grad:   0.6 mmHg                       RVOT Peak grad: 2 mmHg   SHUNTS Systemic Diam: 2.10 cm Kate Sable MD Electronically signed by Kate Sable MD Signature Date/Time: 04/18/2020/1:33:46 PM    Final    DG HIP OPERATIVE UNILAT W OR W/O PELVIS RIGHT  Result Date: 04/18/2020 CLINICAL  DATA:  ORIF of right hip fracture 33 seconds. Images: 4 EXAM: OPERATIVE RIGHT HIP (WITH PELVIS IF PERFORMED) 4 VIEWS TECHNIQUE: Fluoroscopic spot image(s) were submitted for interpretation post-operatively. COMPARISON:  None. FINDINGS: A gamma nail and intramedullary rod were placed across the right hip fracture during the study. IMPRESSION: ORIF right hip fracture as above. Electronically Signed   By: Dorise Bullion III M.D   On: 04/18/2020 12:22   DG HIP UNILAT W OR W/O PELVIS 2-3 VIEWS RIGHT  Result Date: 04/16/2020 CLINICAL DATA:  [place] on right hip. EXAM: DG HIP (WITH OR WITHOUT PELVIS) 2-3V RIGHT COMPARISON:  None. FINDINGS: Bones are diffusely demineralized. Comminuted right intertrochanteric hip fracture noted with varus angulation. Pubic rami unremarkable. IMPRESSION: Comminuted right intertrochanteric hip fracture with varus angulation. Electronically Signed   By: Misty Stanley M.D.   On: 04/16/2020 15:05         ASSESSMENT/PLAN    #1Advanced panlobular emphysema with chronic hypoxemia and alpha-1 antitrypsin deficiency Pulmonary status is currently close to baseline -Recommend incentive spirometry and chest physiotherapy throughout hospitalization -Typical COPD care path without exacerbation -04/20/20-reviewed CT chest - there is no infiltrate, but there is bronchial thickening with chronic severe panlobular emphysema - continue with steroids and po zithromax for COPD exacerbation only      #2Presurgical pulmonary evaluation -Comminuted right intertrochanteric hip fracture with varus Angulation. ARISCAT (  [place]) preoperative pulmonary risk index in adults-42.1%-high risk for pulmonary intra and post operative complication [place] respiratory failure index:  High risk 10.1% risk of post operative respiratory failure [place] Safe-  Post operative Respiratory Failure Risk Calculator: 27.9% -High risk Postoperative respiratory failure (PRF)  Baseline advanced panlobular  emphysema Severe protein calorie malnutrition Pulmonary hypertension per TTE 03/16/2018 -will order repeat study prior to surgery Bronchoscopy 09/19/2017 - phlegm throughout airways -Spirometry 04/17/20 - FEV1 - 11% predicted >>>>consistent with known history of advanced panlobular emphysema and COPD GOLD stage 4D - consider pulmonary transplant evaluation  -Overall patient with high pre-test probability of post operative respiratory complications - if surgery is absolutely required patient may require prolonged intubation with difficulty weaning/liberating from mechanical ventilation and may require prolonged hospitalization.      #3Acute comminuted right hip fracture   -Orthopedic surgery on case-appreciate input   -Pulmonary presurgical evaluation & medical optimization in process  -s/p surge POD- 0     Acute blood loss anemia    - this will certainly affect patients respiratory status - would recommend transfusion if hg <7.0 currently its 7.2   -s/p PRBc transfusion    Thank you for allowing me to participate in the care of this patient.   Patient/Family are satisfied with care plan and all questions have been answered.   This document was prepared using Dragon voice recognition software and may include unintentional dictation errors.     Ottie Glazier, M.D.  [facility]

## 2020-04-21 NOTE — Progress Notes (Signed)
Physical Therapy Treatment Patient Details Name: Gordon Rubio MRN: [id number] DOB: 11-09-1965 Today's Date: 04/21/2020    History of Present Illness  54 y.o. 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  S/p R hip ORIF 12/5.    PT Comments    Pt able to participate with PT but continues to be weak and pain limited.  He was able to tolerate some supine exercises but still needed considerable assist (AAROM) with most tasks and showed poor pain tolerance.  Pt's HR in the 110 range most of the session, O2 in the low 90s on 3-4 L with labored breathing even at rest.  He was able to do some minimal mobility, transfer to recliner but overall remains very functionally limited.  Follow Up Recommendations  Supervision/Assistance - 24 hour     Equipment Recommendations  None recommended by PT    Recommendations for Other Services       Precautions / Restrictions Precautions Precautions: Fall Restrictions Weight Bearing Restrictions: Yes RLE Weight Bearing: Weight bearing as tolerated    Mobility  Bed Mobility Overal bed mobility: Needs Assistance Bed Mobility: Sit to Supine     Supine to sit: Mod assist     General bed mobility comments: Pt again able to show some effort, but could not initiate elevating trunk and needed considerable assistance in getting to sitting.   Transfers Overall transfer level: Needs assistance Equipment used: Rolling walker (2 wheeled) Transfers: Sit to/from Stand Sit to Stand: From elevated surface;Mod assist         General transfer comment: unable to rise from lower bed height, considerable assist even from elevated surface,  Ambulation/Gait             General Gait Details: Unable to tolerate prolonged standing enough to take any real  steps, heavily assisted transfer to recliner w/o any real gait   Stairs             Wheelchair Mobility    Modified Rankin (Stroke Patients Only)       Balance Overall balance assessment: Needs assistance Sitting-balance support: Single extremity supported Sitting balance-Leahy Scale: Fair     Standing balance support: Bilateral upper extremity supported Standing balance-Leahy Scale: Zero Standing balance comment: highly reliant on walker, unable to maintain prolonged balance, quick to fatigue and with hunched posture                            Cognition Arousal/Alertness: Awake/alert Behavior During Therapy: WFL for tasks assessed/performed Overall Cognitive Status: Within Functional Limits for tasks assessed                                        Exercises General Exercises - Lower Extremity Ankle Circles/Pumps: AROM;10 reps Quad Sets: Strengthening;10 reps Short Arc Quad: Strengthening;AROM;10 reps Heel Slides: AAROM;10 reps (with resisted leg extenisons) Hip ABduction/ADduction: AROM;AAROM;15 reps    General Comments        Pertinent Vitals/Pain Pain Assessment: 0-10 Pain Score: 8  Pain Location: R hip    Home Living                      Prior Function  PT Goals (current goals can now be found in the care plan section) Progress towards PT goals: Progressing toward goals    Frequency    BID      PT Plan Current plan remains appropriate    Co-evaluation              AM-PAC PT "6 Clicks" Mobility   Outcome Measure  Help needed turning from your back to your side while in a flat bed without using bedrails?: A Little Help needed moving from lying on your back to sitting on the side of a flat bed without using bedrails?: A Lot Help needed moving to and from a bed to a chair (including a wheelchair)?: A Lot Help needed standing up from a chair using your arms (e.g., wheelchair or bedside  chair)?: A Lot Help needed to walk in hospital room?: A Lot Help needed climbing 3-5 steps with a railing? : Total 6 Click Score: 12    End of Session Equipment Utilized During Treatment: Gait belt;Oxygen Activity Tolerance: Patient limited by fatigue;Patient limited by pain Patient left: with bed alarm set;with call bell/phone within reach Nurse Communication: Mobility status PT Visit Diagnosis: Muscle weakness (generalized) (M62.81);Unsteadiness on feet (R26.81);Pain;Difficulty in walking, not elsewhere classified (R26.2);History of falling (Z91.81) Pain - Right/Left: Right Pain - part of body: Hip     Time: 0922-0951 PT Time Calculation (min) (ACUTE ONLY): 29 min  Charges:  $Gait Training: 8-22 mins $Therapeutic Exercise: 8-22 mins                     Kreg Shropshire, DPT 04/21/2020, 11:33 AM

## 2020-04-21 NOTE — Progress Notes (Signed)
   Subjective: 3 Days Post-Op Procedure(s) (LRB): INTRAMEDULLARY (IM) NAIL INTERTROCHANTRIC (Right) Patient reports pain as severe. Pain better than yesterday Patient is well, and has had no acute complaints or problems Denies any CP, SOB, ABD pain. We will start physical therapy today.   Objective: Vital signs in last 24 hours: Temp:  [97.5 F (36.4 C)-98.7 F (37.1 C)] 97.9 F (36.6 C) (12/08 0733) Pulse Rate:  [90-120] 96 (12/08 0733) Resp:  [15-19] 16 (12/08 0733) BP: (87-117)/(63-90) 103/81 (12/08 0733) SpO2:  [95 %-100 %] 99 % (12/08 0733)  Intake/Output from previous day: 12/07 0701 - 12/08 0700 In: 943.4 [I.V.:403.4; Blood:440; IV Piggyback:100] Out: 1000 [Urine:1000] Intake/Output this shift: No intake/output data recorded.  Recent Labs    04/19/20 0428 04/19/20 1636 04/20/20 0538 04/21/20 0620  HGB 7.8* 8.6* 7.2* 8.9*   Recent Labs    04/20/20 0538 04/21/20 0620  WBC 5.5 4.0  RBC 2.68* 3.11*  HCT 24.3* 27.6*  PLT 144* 150   Recent Labs    04/20/20 0538 04/21/20 0620  NA 137 137  K 5.9* 4.9  CL 95* 94*  CO2 35* 38*  BUN 11 12  CREATININE 0.40* 0.36*  GLUCOSE 108* 97  CALCIUM 8.2* 8.0*   No results for input(s): LABPT, INR in the last 72 hours.  EXAM General - Patient is Alert, Appropriate and Oriented Extremity - Neurovascular intact Sensation intact distally Intact pulses distally Dorsiflexion/Plantar flexion intact No cellulitis present Compartment soft Dressing - dressing C/D/I and moderate drainage Motor Function - intact, moving foot and toes well on exam.   Past Medical History:  Diagnosis Date  . Cancer ([place])   . Chronic pain syndrome   . COPD (chronic obstructive pulmonary disease) ([place])   . Crohn's disease ([place])   . Peripheral T-cell lymphoma (HCC)     Assessment/Plan:   3 Days Post-Op Procedure(s) (LRB): INTRAMEDULLARY (IM) NAIL INTERTROCHANTRIC (Right) Principal Problem:   Closed right hip fracture (HCC) Active  Problems:   Fall   Peripheral T-cell lymphoma ([place])   End stage COPD ([place])   Depression with anxiety   Hypokalemia   Hypocalcemia   Crohn disease (HCC)   Hypomagnesemia   Chronic pain syndrome   Protein-calorie malnutrition, severe (HCC)  Estimated body mass index is 12.55 kg/m as calculated from the following:   Height as of this encounter: 5\' 9"  (1.753 m).   Weight as of this encounter: 38.6 kg. Advance diet Up with therapy  VSS Acute post op blood loss anemia with underlying chronic anemia - Hgb 8.9, stable.  Care manager to assist with discharge to [facility]   Patient to have staples removed and steri strips applied on 05/02/2020.  Follow up with [place] ortho in 6 weeks for xray of Right femur Lovenox 30 mg [place] daily x 14 days at discharge TED hose BLE x 6 weeks  DVT Prophylaxis - Lovenox, TED hose and SCDs Weight-Bearing as tolerated to right leg   T. Rachelle Hora, PA-C [place] 04/21/2020, 8:18 AM

## 2020-04-21 NOTE — Progress Notes (Signed)
[facility] [place] [facility] patient RN note:  Gordon Rubio is a current hospice patient with a terminal diagnosis ofend stage COPD due to alpha1 antitrypsin deficiency, who presented with fall and right hip pain to the Emergency Department on 12.03. Patient recently moved to [facility] on 12.01. Pt states that he fell when was walking and tripped. He hit his head, and injured his right hip. He developed right hip pain, which is constant, sharp, and severe. No loss of consciousness. No unilateral numbness or tingling in his extremities. He was admitted 12.03 with intertrochanteric hip fracture with severe osteopenia. On 12.05 he underwent an  intramedullary nail of intertrochanteric right hip fracture. He is a DNR and this was a related admission per Dr. Gilford Rile with [facility].  Visited patient at bedside. He was up to chair and stated that he was doing well and pain was controlled. Spoke with sister, Angelita Ingles over the phone. Patient has been accepted at [facility] for rehab. Patient and sister understand that patient will need to revoke hospice while doing rehab. Revocation paperwork was completed via docu-sign by [place]. Plan is for discharge tomorrow.  Vital Signs: BP 94/66, HR 100, Resp 17, Temp 98.6, O2 sat  97% on 3 liters [place]  I & O: 949ml/ 1064ml  Abnormal labs:  Chloride: 94 (L) CO2: 38 (H) Creatinine: 0.36 (L) Calcium: 8.0 (L) Magnesium: 1.4 (L) RBC: 3.11 (L) Hemoglobin: 8.9 (L) HCT: 27.6 (L)  Diagnostics: none new  IV/PRN: Meds: methylPREDNISolone sodium succinate (SOLU-MEDROL) 40 mg/mL injection 40 mg Dose: 40 mg Freq: Every 12 hours Route: IV Start: 04/19/20 1815  morphine 2 MG/ML injection 1 mg Dose: 1 mg Freq: Every 1 hour PRN Route: IV  Problem List: Principal Problem:   Closed right hip fracture (HCC) Active Problems:   Fall   Peripheral T-cell lymphoma (HCC)   End stage COPD ([place])    Depression with anxiety   Hypokalemia   Hypocalcemia   Crohn disease (HCC)   Hypomagnesemia   Chronic pain syndrome   Protein-calorie malnutrition, severe (HCC)  Nutrition Problem: Severe Malnutrition Etiology: chronic illness (end-stage COPD due to alpha-1-antitrypsin deficiency, stage IV peripheral T-cell lymphoma, Crohn's disease, short bowel syndrome)  Signs/Symptoms: severe fat depletion, severe muscle depletion, percent weight loss Percent weight loss: 23.6 %  Body mass index is 12.55 kg/m.  (Underweight)  Persistent Hypotension: Patient's blood pressure is much better now that he received blood transfusion yesterday.  Continue midodrine 5 mg 3 times daily.  He is asymptomatic, alert and oriented.  Systolic Heart Failure with 40-45%: He has been on gentle fluids for low blood pressure. - Monitor respiratory status. - Give Lasix if BP allows.  Acute Metabolic Encephalopathy, unspecified: likely due to medications received.  - Head CT showed no acute abnormality. - Mental status is back to baseline.  Acute Respiratory Distress / Tachypnea / End-stage COPD with chronic hypoxemic respiratory failure 3L [place]. - CT of chest showed no PE and mild pleural effusions. - On Day 3 of IV steroids and nebs per Pulmonary.  Appreciate their help.  Hyperkalemia: Resolved.  Hospice Medications - Start home MS contin 20 mg BID - increase as tolerated. - Can give Ativan 0.25 q6hrs and IV Morphine 1 mg q1hr PRN. - Start bowel regimen.  Left Upper Extremity Edema due to IV site Infiltration: This was noted on CT Scan. - Keep arm wrapped and elevated.  Monitor.  Acute comminuted right hip fracture secondary to  mechanical fall s/p 12/5 intramedullary nail fixation:  - Pain is under control.  Management per orthopedics.  Stage IV T-cell lymphoma: - S/P immunotherapy and chemotherapy.    Advanced Crohn's disease - Continue Entocort  Osteoporosis multiple wedge compression  fractures of the mid thoracic spine and upper lumbar spine.   - Continue analgesics as tolerated.  Depression and anxiety:  - Continue antipsychotics.  Acute blood loss/postoperative anemia: Patient's hemoglobin dropped to 7.2 on 04/20/2020 and he received 1 unit of PRBC transfusion.  Hemoglobin today is 8.9.  Monitor.  Discharge Planning: Patient has revoked hospice and will be discharging in the AM to [facility] for rehab.  Family Contact: Spoke with sister, Angelita Ingles over the phone.  IDG: Updated  Goals of Care: Clear  Please call with any hospice related questions or concerns.  Zandra Abts, RN [facility] Liaison [phone]

## 2020-04-21 NOTE — TOC Progression Note (Signed)
Transition of Care [facility]) - Progression Note    Patient Details  Name: Gordon Rubio MRN: [id number] Date of Birth: December 14, 1965  Transition of Care [facility]) CM/SW Contact  Shelbie Ammons, RN Phone Number: 04/21/2020, 3:08 PM  Clinical Narrative:   RNCM met with patient at bedside after receiving bed offer from [facility]. Patient is agreeable to going to [facility] but requests that this CM reach out to his sister. RNCM placed call to patient's sister Angelita Ingles and she was pleased and agreeable to patient going to [facility]. RNCM reached out to [facility] with [facility] and Hospice revocation was completed. Per Seth Bake with [facility] they will be able to admit patient tomorrow.          Expected Discharge Plan and Services                                                 Social Determinants of Health (SDOH) Interventions    Readmission Risk Interventions No flowsheet data found.

## 2020-04-21 NOTE — Progress Notes (Signed)
PT Cancellation Note  Patient Details Name: Gordon Rubio MRN: [id number] DOB: [birth year]   Cancelled Treatment:    Reason Eval/Treat Not Completed: Patient declined, no reason specified Entered pt's room for PM session, he was already back in bed.  He looked quite uncomfortable and sites pain, fatigue and generally feeling poorly (as well as only recently having gotten back to bed) as reasons to not work with PT this afternoon.  Pt, admittedly, did look quite uncomfortable and though pleasant, was very adamant about not doing anything this afternoon.  Reports he will try to go some tomorrow morning before he goes to rehab, but "No more PT today, please."  Kreg Shropshire, DPT 04/21/2020, 3:40 PM

## 2020-04-21 NOTE — Progress Notes (Signed)
Progress Note    Gordon Rubio  KCL:[id number] DOB: [birth year]  DOA: 04/16/2020 PCP: Einar Crow, MD      Brief Narrative:    Medical records reviewed and are as summarized below:  Gordon Rubio is a 54 y.o. male with medical history significant of advanced centrilobular emphysema and panlobular emphysema with chronic hypoxic respiratory failure on 3L [place] at home, aggressive T Cell lymphoma s/p chemotherapy, severe Crohn's disease, severe protein calorie malnutrition, who is a patient under hospice care up until this admission.  He presented to the hospital on 12/4 after a mechanical fall found to have an acute comminuted right hip fracture s/p 12/5 intramedullary nail fixation.  Postoperatively patient's blood pressure has been running low.   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  (Underweight)  Persistent Hypotension: Patient's blood pressure is much better now that he received blood transfusion yesterday.  Continue midodrine 5 mg 3 times daily.  He is asymptomatic, alert and oriented.  Systolic Heart Failure with 40-45%: He has been on gentle fluids for low blood pressure. - Monitor respiratory status. - Give Lasix if BP allows.  Acute Metabolic Encephalopathy, unspecified: likely due to medications received.  - Head CT showed no acute  abnormality. - Mental status is back to baseline.  Acute Respiratory Distress / Tachypnea / End-stage COPD with chronic hypoxemic respiratory failure 3L [place]. - CT of chest showed no PE and mild pleural effusions. - On Day 3 of IV steroids and nebs per Pulmonary.  Appreciate their help.  Hyperkalemia: Resolved.  Hospice Medications - Start home MS contin 20 mg BID - increase as tolerated. - Can give Ativan 0.25 q6hrs and IV Morphine 1 mg q1hr PRN. - Start bowel regimen.  Left Upper Extremity Edema due to IV site Infiltration: This was noted on CT Scan. - Keep arm wrapped and elevated.  Monitor.  Acute comminuted right hip fracture secondary to mechanical fall s/p 12/5 intramedullary nail fixation:  - Pain is under control.  Management per orthopedics.  Stage IV T-cell lymphoma: - S/P immunotherapy and chemotherapy.    Advanced Crohn's disease - Continue Entocort  Osteoporosis multiple wedge compression fractures of the mid thoracic spine and upper lumbar spine.   - Continue analgesics as tolerated.  Depression and anxiety:  - Continue antipsychotics.  Acute blood loss/postoperative anemia: Patient's hemoglobin dropped to 7.2 on 04/20/2020 and he received 1 unit of PRBC transfusion.  Hemoglobin today is 8.9.  Monitor.   Diet Order            Diet regular Room service appropriate? Yes; Fluid consistency: Thin  Diet effective now                 Consultants:  Pulmonary  Orthopedic Surgery  Procedures:  Intramedullary nail right hip on 04/18/2020   Medications:   . acetaminophen  650 mg Oral Q6H  . azithromycin  250 mg Oral Daily  . budesonide  9 mg Oral q AM  . calcium citrate  400 mg of elemental calcium Oral Daily  . Chlorhexidine Gluconate Cloth  6 each Topical Daily  . docusate sodium  100 mg Oral BID  . enoxaparin (LOVENOX) injection  30 mg Subcutaneous Q24H  . feeding supplement  237 mL Oral TID BM  . ferrous [id number] C-folic acid  1  capsule Oral BID  . ipratropium-albuterol  3 mL Nebulization TID  . methylPREDNISolone (SOLU-MEDROL) injection  40 mg Intravenous Q12H  . midodrine  5 mg Oral TID WC  . mirtazapine  45 mg Oral QHS  . morphine  15 mg Oral Q12H  . multivitamin with minerals  1 tablet Oral Daily  . OLANZapine  10 mg Oral QHS  . OLANZapine  5 mg Oral Daily  . pantoprazole  40 mg Oral Daily  . sertraline  100 mg Oral Daily   Continuous Infusions: . sodium chloride Stopped (04/18/20 1559)     Anti-infectives (From admission, onward)   Start     Dose/Rate Route Frequency Ordered Stop   04/19/20 1815  azithromycin (ZITHROMAX) tablet 250 mg        250 mg Oral Daily 04/19/20 1717 04/24/20 0959   04/18/20 1500  ceFAZolin (ANCEF) IVPB 1 g/50 mL premix        1 g 100 mL/hr over 30 Minutes Intravenous Every 6 hours 04/18/20 1401 04/19/20 0407   04/18/20 1100  ceFAZolin (ANCEF) IVPB 1 g/50 mL premix        1 g 100 mL/hr over 30 Minutes Intravenous  Once 04/17/20 1238 04/18/20 1026       Family Communication/Anticipated D/C date and plan/Code Status   DVT prophylaxis: enoxaparin (LOVENOX) injection 30 mg Start: 04/19/20 0800 SCDs Start: 04/18/20 1401 SCDs Start: 04/16/20 1653     Code Status: DNR  Family Communication: None present.  Plan of care discussed with patient himself. Disposition Plan:    Status is: Inpatient  Remains inpatient appropriate because:Unsafe d/c plan and Inpatient level of care appropriate due to severity of illness   Dispo: The patient is from: Home with Hospice services.              Anticipated d/c is to: SNF.              Anticipated d/c date is: 1 day.              Patient currently is medically stable to d/c.   Subjective:   Patient seen and examined.  He has no complaints.  He is alert and oriented.  Objective:    Vitals:   04/21/20 0416 04/21/20 0733 04/21/20 0951 04/21/20 1209  BP: 110/85 103/81  99/77  Pulse: 93 96  93  Resp: 16 16  18   Temp: 97.7 F  (36.5 C) 97.9 F (36.6 C)  98 F (36.7 C)  TempSrc:  Oral  Oral  SpO2: 100% 99% 94% 95%  Weight:      Height:       No data found.   Intake/Output Summary (Last 24 hours) at 04/21/2020 1232 Last data filed at 04/21/2020 0900 Gross per 24 hour  Intake 943.37 ml  Output 1000 ml  Net -56.63 ml   Filed Weights   04/16/20 1344  Weight: 38.6 kg    Exam:  General exam: Appears calm and comfortable  Respiratory system: Diminished breath sounds bilaterally, no wheezes or crackles or rhonchi. Respiratory effort normal.  Cardiovascular system: S1 & S2 heard, RRR. No JVD, murmurs, rubs, gallops or clicks. No pedal edema. Gastrointestinal system: Abdomen is nondistended, soft and nontender. No organomegaly or masses felt. Normal bowel sounds heard. Central nervous system: Alert and oriented. No focal neurological deficits. Extremities: Symmetric 5 x 5 power. Skin: No rashes, lesions or ulcers.  Psychiatry: Judgement and insight appear normal. Mood & affect appropriate.    Data Reviewed:   I have personally reviewed following labs and imaging studies:  Labs: Labs show the following:   Basic Metabolic Panel: Recent Labs  Lab 04/16/20 1348 04/16/20 1348 04/17/20 0337 04/17/20 0337 04/18/20 0549 04/18/20 0549 04/19/20 0428 04/19/20 0428 04/19/20 1636 04/19/20 1636 04/20/20 0538 04/21/20 0620  NA 134*   < > 130*   < > 135  --  137  --  138  --  137 137  K 2.2*   < > 3.3*   < > 4.0   < > 4.4   < > 5.2*   < > 5.9* 4.9  CL 79*   < > 77*   < > 88*  --  96*  --  96*  --  95* 94*  CO2 40*   < > 41*   < > 36*  --  35*  --  33*  --  35* 38*  GLUCOSE 126*   < > 112*   < > 96  --  72  --  132*  --  108* 97  BUN 7   < > 7   < > 11  --  11  --  13  --  11 12  CREATININE 0.64   < > 0.64   < > 0.51*  --  0.43*  --  0.45*  --  0.40* 0.36*  CALCIUM 5.4*   < > 7.1*   < > 7.8*  --  7.7*  --  8.0*  --  8.2* 8.0*  MG 0.6*  --  2.0  --   --   --   --   --   --   --   --  1.4*  PHOS 2.8  --   3.6  --   --   --   --   --   --   --   --  3.3   < > = values in this interval not displayed.   GFR Estimated Creatinine Clearance: 57 mL/min (A) (by C-G formula based on SCr of 0.36 mg/dL (L)). Liver Function Tests: Recent Labs  Lab 04/16/20 1348 04/19/20 1636  AST 33 26  ALT 18 19  ALKPHOS 116 112  BILITOT 0.7 0.9  PROT 5.1* 5.4*  ALBUMIN 2.1* 2.1*   Coagulation profile Recent Labs  Lab 04/16/20 1702  INR 1.0    CBC: Recent Labs  Lab 04/18/20 0549 04/19/20 0428 04/19/20 1636 04/20/20 0538 04/21/20 0620  WBC 10.1 5.9 9.1 5.5 4.0  NEUTROABS 8.9*  --   --   --   --   HGB 9.2* 7.8* 8.6* 7.2* 8.9*  HCT 28.7* 24.8* 28.1* 24.3* 27.6*  MCV 88.0 89.2 90.6 90.7 88.7  PLT 158 135* 180 144* 150   Sepsis Labs: Recent Labs  Lab 04/19/20 0428 04/19/20 1636 04/19/20 1945 04/20/20 0538 04/21/20 0620  PROCALCITON  --   --  0.16  --   --   WBC 5.9 9.1  --  5.5 4.0  LATICACIDVEN  --  0.8 0.7  --   --  Microbiology Recent Results (from the past 240 hour(s))  Resp Panel by RT-PCR (Flu A&B, Covid) Nasopharyngeal Swab     Status: None   Collection Time: 04/16/20  2:41 PM   Specimen: Nasopharyngeal Swab; Nasopharyngeal(NP) swabs in vial transport medium  Result Value Ref Range Status   SARS Coronavirus 2 by RT PCR NEGATIVE NEGATIVE Final    Comment: (NOTE) SARS-CoV-2 target nucleic acids are NOT DETECTED.  The SARS-CoV-2 RNA is generally detectable in upper respiratory specimens during the acute phase of infection. The lowest concentration of SARS-CoV-2 viral copies this assay can detect is 138 copies/mL. A negative result does not preclude SARS-Cov-2 infection and should not be used as the sole basis for treatment or other patient management decisions. A negative result may occur with  improper specimen collection/handling, submission of specimen other than nasopharyngeal swab, presence of viral mutation(s) within the areas targeted by this assay, and inadequate  number of viral copies(<138 copies/mL). A negative result must be combined with clinical observations, patient history, and epidemiological information. The expected result is Negative.  Fact Sheet for Patients:  EntrepreneurPulse.com.au  Fact Sheet for Healthcare Providers:  IncredibleEmployment.be  This test is no t yet approved or cleared by the [place] FDA and  has been authorized for detection and/or diagnosis of SARS-CoV-2 by FDA under an Emergency Use Authorization (EUA). This EUA will remain  in effect (meaning this test can be used) for the duration of the COVID-19 declaration under Section 564(b)(1) of the Act, 21 U.S.C.section 360bbb-3(b)(1), unless the authorization is terminated  or revoked sooner.       Influenza A by PCR NEGATIVE NEGATIVE Final   Influenza B by PCR NEGATIVE NEGATIVE Final    Comment: (NOTE) The Xpert Xpress SARS-CoV-2/FLU/RSV plus assay is intended as an aid in the diagnosis of influenza from Nasopharyngeal swab specimens and should not be used as a sole basis for treatment. Nasal washings and aspirates are unacceptable for Xpert Xpress SARS-CoV-2/FLU/RSV testing.  Fact Sheet for Patients: EntrepreneurPulse.com.au  Fact Sheet for Healthcare Providers: IncredibleEmployment.be  This test is not yet approved or cleared by the [place] FDA and has been authorized for detection and/or diagnosis of SARS-CoV-2 by FDA under an Emergency Use Authorization (EUA). This EUA will remain in effect (meaning this test can be used) for the duration of the COVID-19 declaration under Section 564(b)(1) of the Act, 21 U.S.C. section 360bbb-3(b)(1), unless the authorization is terminated or revoked.  Performed at [facility], [address] [place], [place] [address]   CULTURE, BLOOD (ROUTINE X 2) w Reflex to ID Panel     Status: None (Preliminary result)   Collection  Time: 04/19/20  4:37 PM   Specimen: BLOOD  Result Value Ref Range Status   Specimen Description BLOOD BLOOD RIGHT HAND  Final   Special Requests   Final    BOTTLES DRAWN AEROBIC AND ANAEROBIC Blood Culture adequate volume   Culture   Final    NO GROWTH 2 DAYS Performed at [facility], [address] [place],  [address]    Report Status PENDING  Incomplete  CULTURE, BLOOD (ROUTINE X 2) w Reflex to ID Panel     Status: None (Preliminary result)   Collection Time: 04/19/20  4:43 PM   Specimen: BLOOD  Result Value Ref Range Status   Specimen Description BLOOD BLOOD LEFT HAND  Final   Special Requests   Final    BOTTLES DRAWN AEROBIC AND ANAEROBIC Blood Culture adequate volume  Culture   Final    NO GROWTH 2 DAYS Performed at [facility], [place],  [address]    Report Status PENDING  Incomplete    Procedures and diagnostic studies:  DG Chest 1 View  Result Date: 04/19/2020 CLINICAL DATA:  Shortness of breath. EXAM: CHEST  1 VIEW COMPARISON:  April 16, 2020. FINDINGS: The heart size and mediastinal contours are within normal limits. Both lungs are clear. No pneumothorax or pleural effusion is noted. Left internal jugular Port-A-Cath is unchanged in position. The visualized skeletal structures are unremarkable. IMPRESSION: No active disease. Electronically Signed   By: Marijo Conception M.D.   On: 04/19/2020 17:43   CT HEAD WO CONTRAST  Result Date: 04/19/2020 CLINICAL DATA:  Delirium. EXAM: CT HEAD WITHOUT CONTRAST TECHNIQUE: Contiguous axial images were obtained from the base of the skull through the vertex without intravenous contrast. COMPARISON:  Prior head CT 04/16/2020. FINDINGS: Brain: Mildly motion degraded examination. There is no acute intracranial hemorrhage. No demarcated cortical infarct. No extra-axial fluid collection. No evidence of intracranial mass. No midline shift. Vascular: No hyperdense vessel. Skull: Normal.  Negative for fracture or focal lesion. Sinuses/Orbits: Visualized orbits show no acute finding. Small left maxillary sinus mucous retention cyst. Trace fluid within the bilateral mastoid air cells. IMPRESSION: Mildly motion degraded examination. No evidence of acute intracranial abnormality. Electronically Signed   By: Kellie Simmering DO   On: 04/19/2020 19:46   CT ANGIO CHEST PE W OR WO CONTRAST  Addendum Date: 04/19/2020   ADDENDUM REPORT: 04/19/2020 19:49 ADDENDUM: Study discussed by telephone with Provider Sharion Settler on 04/19/2020 at 1933 hours. We discussed surveillance of the right antecubital fossa contrast extravasation site, with a low threshold for consulting Surgery if any alarming skin changes do occur. Electronically Signed   By: Genevie Ann M.D.   On: 04/19/2020 19:49   Result Date: 04/19/2020 CLINICAL DATA:  54 year old male with chest pain and shortness of breath. EXAM: CT ANGIOGRAPHY CHEST WITH CONTRAST TECHNIQUE: Multidetector CT imaging of the chest was performed using the standard protocol during bolus administration of intravenous contrast. Multiplanar CT image reconstructions and MIPs were obtained to evaluate the vascular anatomy. CONTRAST:  Total of 135 mL OMNIPAQUE IOHEXOL 350 MG/ML SOLN, after a portion of the initial 75 mL contrast dose extravasated in the right antecubital fossa. A portion of that contrast extravasation is visible on series 9, image 109 - roughly estimated at 50 mL. I was contacted by the CT technologists regarding this extravasation at 1821 hours. They advised the right AC was swollen and mildly erythematous. I advised application of ice and also elevation of the extremity as possible. Appropriate EMR post extravasation order-set was also placed by the technologists. A 2nd IV was placed into the left upper extremity and ultimately the study was repeated at 1851 hours. COMPARISON:  Portable chest x-ray earlier today. FINDINGS: Cardiovascular: Ultimately good contrast  bolus timing in the pulmonary arterial tree. No focal filling defect identified in the pulmonary arteries to suggest acute pulmonary embolism. No cardiomegaly or pericardial effusion. Left chest power port redemonstrated. Negative visible aorta aside from minor atherosclerosis. Mediastinum/Nodes: Negative.  No mediastinal lymphadenopathy. Lungs/Pleura: Trace bilateral layering pleural fluid. Diffuse centrilobular emphysema. Major airways are patent. There is very mild left lower lobe posterior basal segment peribronchial opacity (series 8, image 66). Mild generalized bronchial wall thickening. No consolidation. No pulmonary nodule identified. Upper Abdomen: Negative visible liver, spleen, pancreas, adrenal glands and kidneys (some excreted IV  contrast from the initial bolus noted). Mildly to moderately gas and fluid distended stomach. Musculoskeletal: Diffuse osteopenia and thoracic spinal compression fractures. The T1 thoracic level is least compressed. No significant thoracic retropulsion. The visible upper lumbar levels L1 through L3 are also compressed. Superimposed occasional chronic appearing rib fractures (right lateral 8th rib). No definite acute osseous abnormality. Partially visible right antecubital fossa contrast extravasation volume on series 9, image 109, roughly estimated at 50 mL. Review of the MIP images confirms the above findings. IMPRESSION: 1. Negative for pulmonary embolus. 2. Right antecubital fossa contrast extravasation as detailed above. 3. Extensive Emphysema (ICD10-J43.9) with mild left lower lobe peribronchial infection suspected. Trace bilateral pleural effusions. 4. Osteopenia with diffuse compression fractures throughout the visible thoracic and lumbar spine. Electronically Signed: By: Genevie Ann M.D. On: 04/19/2020 19:31     LOS: 5 days   [place] [place] [facility]   Pager on [email or web address]. If 7PM-7AM, please contact night-coverage at [email or web address]   04/21/2020, 12:32 PM

## 2020-04-22 LAB — BASIC METABOLIC PANEL
Anion gap: 5 (ref 5–15)
BUN: 11 mg/dL (ref 6–20)
CO2: 35 mmol/L — ABNORMAL HIGH (ref 22–32)
Calcium: 7.9 mg/dL — ABNORMAL LOW (ref 8.9–10.3)
Chloride: 96 mmol/L — ABNORMAL LOW (ref 98–111)
Creatinine, Ser: 0.33 mg/dL — ABNORMAL LOW (ref 0.61–1.24)
GFR, Estimated: >60 mL/min (ref >60)
Glucose, Bld: 78 mg/dL (ref 70–99)
Potassium: 4.1 mmol/L (ref 3.5–5.1)
Sodium: 136 mmol/L (ref 135–145)

## 2020-04-22 LAB — CBC
HCT: 27.8 % — ABNORMAL LOW (ref 39.0–52.0)
Hemoglobin: 8.8 g/dL — ABNORMAL LOW (ref 13.0–17.0)
MCH: 28 pg (ref 26.0–34.0)
MCHC: 31.7 g/dL (ref 30.0–36.0)
MCV: 88.5 fL (ref 80.0–100.0)
Platelets: 170 10*3/uL (ref 150–400)
RBC: 3.14 MIL/uL — ABNORMAL LOW (ref 4.22–5.81)
RDW: 13.9 % (ref 11.5–15.5)
WBC: 4.1 10*3/uL (ref 4.0–10.5)
nRBC: 0 % (ref 0.0–0.2)

## 2020-04-22 LAB — RESP PANEL BY RT-PCR (FLU A&B, COVID) ARPGX2
Influenza A by PCR: NEGATIVE
Influenza B by PCR: NEGATIVE
SARS Coronavirus 2 by RT PCR: NEGATIVE

## 2020-04-22 LAB — MAGNESIUM: Magnesium: 1.6 mg/dL — ABNORMAL LOW (ref 1.7–2.4)

## 2020-04-22 LAB — PHOSPHORUS: Phosphorus: 1.9 mg/dL — ABNORMAL LOW (ref 2.5–4.6)

## 2020-04-22 MED ORDER — ENOXAPARIN SODIUM 30 MG/0.3ML ~~LOC~~ SOLN: 30.0000 mg | SUBCUTANEOUS | 0 refills | Status: AC

## 2020-04-22 MED ORDER — METHYLPREDNISOLONE 4 MG PO TBPK: 4.0000 mg | ORAL_TABLET | ORAL | Status: DC

## 2020-04-22 MED ORDER — MAGNESIUM SULFATE 2 GM/50ML IV SOLN
2.0000 g | Freq: Once | INTRAVENOUS | Status: AC
  Administered 2020-04-22: 2 g via INTRAVENOUS
  Filled 2020-04-22: qty 50

## 2020-04-22 MED ORDER — METHYLPREDNISOLONE 4 MG PO TBPK: 8.0000 mg | ORAL_TABLET | Freq: Every evening | ORAL | Status: DC

## 2020-04-22 MED ORDER — PREDNISONE 20 MG PO TABS: 20.0000 mg | ORAL_TABLET | Freq: Every day | ORAL | Status: DC

## 2020-04-22 MED ORDER — MORPHINE SULFATE ER 15 MG PO TBCR: 15.0000 mg | EXTENDED_RELEASE_TABLET | Freq: Two times a day (BID) | ORAL | 0 refills | Status: AC

## 2020-04-22 MED ORDER — SODIUM CHLORIDE 0.9% FLUSH
10.0000 mL | Freq: Two times a day (BID) | INTRAVENOUS | Status: DC
  Administered 2020-04-22: 10 mL

## 2020-04-22 MED ORDER — PREDNISONE 20 MG PO TABS: 20.0000 mg | ORAL_TABLET | Freq: Every day | ORAL | 0 refills | Status: AC

## 2020-04-22 MED ORDER — SODIUM CHLORIDE 0.9% FLUSH: 10.0000 mL | INTRAVENOUS | Status: DC | PRN

## 2020-04-22 MED ORDER — METHYLPREDNISOLONE 4 MG PO TBPK: 4.0000 mg | ORAL_TABLET | Freq: Three times a day (TID) | ORAL | Status: DC

## 2020-04-22 MED ORDER — POTASSIUM & SODIUM PHOSPHATES 280-160-250 MG PO PACK
1.0000 | PACK | Freq: Three times a day (TID) | ORAL | Status: DC
  Administered 2020-04-22 (×2): 1 via ORAL
  Filled 2020-04-22 (×4): qty 1

## 2020-04-22 MED ORDER — HEPARIN SOD (PORK) LOCK FLUSH 100 UNIT/ML IV SOLN
500.0000 [IU] | Freq: Once | INTRAVENOUS | Status: AC
  Administered 2020-04-22: 500 [IU] via INTRAVENOUS
  Filled 2020-04-22: qty 5

## 2020-04-22 MED ORDER — METHYLPREDNISOLONE 4 MG PO TBPK: 4.0000 mg | ORAL_TABLET | Freq: Four times a day (QID) | ORAL | Status: DC

## 2020-04-22 MED ORDER — POTASSIUM PHOSPHATES 15 MMOLE/5ML IV SOLN: 30.0000 mmol | Freq: Once | INTRAVENOUS | Status: DC

## 2020-04-22 MED ORDER — METHYLPREDNISOLONE 4 MG PO TBPK
8.0000 mg | ORAL_TABLET | Freq: Every morning | ORAL | Status: DC
  Filled 2020-04-22: qty 21

## 2020-04-22 NOTE — Progress Notes (Signed)
Physical Therapy Treatment Patient Details Name: Gordon Rubio MRN: [id number] DOB: 08/16/1965 Today's Date: 04/22/2020    History of Present Illness  54 y.o. 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  S/p R hip ORIF 12/5.    PT Comments    Pt feeling better this AM than yesterday afternoon, but still clearly having a lot of pain and frustrated with his situation.  Dual session this AM before d/c to rehab.  Firstly we did bed exercises and bed mobility tasks, but in sitting pt needed to use the commode.  Quickly got BSC in place but he was unable to hold and needed BM clean up.  Returned him to bed and saw later this AM for further mobility and standing/ambulation attempt.  Pt on 3-4L O2 t/o the session with sats generally in the mid 90s (low 90s post effort, high 90s at rest on arrival.) Overall he showed good effort but functionally remains quite limited.     Follow Up Recommendations  Supervision/Assistance - 24 hour     Equipment Recommendations    TBD at rehab   Recommendations for Other Services       Precautions / Restrictions Precautions Precautions: Fall Restrictions RLE Weight Bearing: Weight bearing as tolerated    Mobility  Bed Mobility Overal bed mobility: Needs Assistance Bed Mobility: Sit to Supine     Supine to sit: Mod assist     General bed mobility comments: Pt again able to show some effort, but could not initiate elevating trunk and needed considerable assistance in getting to sitting.   Transfers Overall transfer level: Needs assistance Equipment used: Rolling walker (2 wheeled) Transfers: Sit to/from Stand Sit to Stand: From elevated surface;Mod assist         General transfer comment: unable to rise from lower bed height, considerable assist  even from elevated surface, poor tolerance  Ambulation/Gait Ambulation/Gait assistance: Min assist Gait Distance (Feet): 4 Feet Assistive device: Rolling walker (2 wheeled)       General Gait Details: Pt was able to take a few very small and guarded steps, but his upright tolerance was poor and he started to sit after only very modest effort despite assist and cuing to try more.  Pt's O2 in the 90s t/o the effort on 4L   Stairs             Wheelchair Mobility    Modified Rankin (Stroke Patients Only)       Balance Overall balance assessment: Needs assistance Sitting-balance support: Single extremity supported Sitting balance-Leahy Scale: Fair     Standing balance support: Bilateral upper extremity supported Standing balance-Leahy Scale: Zero Standing balance comment: highly reliant on walker, unable to maintain prolonged balance, quick to fatigue and with hunched posture                            Cognition Arousal/Alertness: Awake/alert Behavior During Therapy: WFL for tasks assessed/performed Overall Cognitive Status: Within Functional Limits for tasks assessed                                        Exercises General Exercises - Lower Extremity Ankle Circles/Pumps: AROM;10 reps Quad  Sets: Strengthening;10 reps Short Arc Quad: Strengthening;AROM;10 reps Heel Slides: AAROM;10 reps (with resisted leg ext) Hip ABduction/ADduction: AROM;AAROM;15 reps Straight Leg Raises: AAROM;10 reps    General Comments General comments (skin integrity, edema, etc.): poor tolerance to essentially all activity      Pertinent Vitals/Pain Pain Assessment: 0-10 Pain Score: 7  Pain Location: R hip    Home Living                      Prior Function            PT Goals (current goals can now be found in the care plan section) Progress towards PT goals: Progressing toward goals    Frequency    BID      PT Plan Current plan  remains appropriate    Co-evaluation              AM-PAC PT "6 Clicks" Mobility   Outcome Measure  Help needed turning from your back to your side while in a flat bed without using bedrails?: A Little Help needed moving from lying on your back to sitting on the side of a flat bed without using bedrails?: A Lot Help needed moving to and from a bed to a chair (including a wheelchair)?: A Lot Help needed standing up from a chair using your arms (e.g., wheelchair or bedside chair)?: A Lot Help needed to walk in hospital room?: Total Help needed climbing 3-5 steps with a railing? : Total 6 Click Score: 11    End of Session Equipment Utilized During Treatment: Gait belt;Oxygen Activity Tolerance: Patient limited by fatigue;Patient limited by pain Patient left: with call bell/phone within reach;with chair alarm set   PT Visit Diagnosis: Muscle weakness (generalized) (M62.81);Unsteadiness on feet (R26.81);Pain;Difficulty in walking, not elsewhere classified (R26.2);History of falling (Z91.81) Pain - Right/Left: Right Pain - part of body: Hip     Time: 0347-4259, 1010-1026 PT Time Calculation (min) (ACUTE ONLY): 39 min  Charges:  $Gait Training: 8-22 mins $Therapeutic Exercise: 8-22 mins $Therapeutic Activity: 8-22 mins                     Kreg Shropshire, DPT 04/22/2020, 12:50 PM

## 2020-04-22 NOTE — Discharge Summary (Addendum)
Physician Discharge Summary  Gordon Rubio PYP:[id number] DOB: 1966/01/07 DOA: 04/16/2020  PCP: Gordon Crow, MD  Admit date: 04/16/2020 Discharge date: 04/22/2020  Admitted From: Home Disposition: SNF  Recommendations for Outpatient Follow-up:  1. Follow up with PCP in 1-2 weeks 2. Follow-up with orthopedics within 2 weeks 3. Please obtain BMP/CBC in one week 4. Please follow up with your PCP on the following pending results: Unresulted Labs (From admission, onward)          Start     Ordered   04/21/20 0500  Magnesium  Daily,   R     Question:  Specimen collection method  Answer:  Lab=Lab collect   04/20/20 1425   04/21/20 0500  Phosphorus  Daily,   R     Question:  Specimen collection method  Answer:  Lab=Lab collect   04/20/20 1425           Home Health: None Equipment/Devices: None  Discharge Condition: Stable CODE STATUS: DNR Diet recommendation: Cardiac  Subjective: Seen and examined.  Feels much better.  No complaints.  Brief/Interim Summary: Gordon Rubio is a 54 y.o. male with medical history significant ofadvanced centrilobular emphysema and panlobular emphysema with chronic hypoxic respiratory failure on 3L [place] at home, aggressive T Cell lymphoma s/p chemotherapy, severe Crohn's disease, severe protein calorie malnutrition, who is a patient under hospice care up until this admission.  He presented to the hospital on 12/4 after a mechanical fall found to have an acute comminuted right hip fracture s/p 12/5 intramedullary nail fixation by orthopedics.  Postoperatively patient's blood pressure was running low.  His Lasix was held for that reason.  Midodrine was continued.  Patient continued to require 3 L of nasal oxygen which is his baseline.  He was also evaluated by his pulmonologist and was cleared for surgery and was also followed postoperatively.  He was continued on his home hospice medications which included Ativan and MS Contin.  During this  hospitalization, at some point in time, he also had some confusion and he was diagnosed with acute metabolic encephalopathy.  CT head was negative.  His mental status improved and he is back to his baseline which is alert and oriented for last 2 to 3 days.  He had acute blood loss anemia postoperatively secondary to surgery and his hemoglobin dropped to 7.2 for which she received 1 unit of PRBC transfusion on 04/20/2020.  This helped improve his blood pressure as well.  He was evaluated by PT OT postoperatively and they recommended SNF.  He revoked his hospice status and he is being discharged to SNF in stable condition today.  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[place])   Depression with anxiety   Hypokalemia   Hypocalcemia   Crohn disease ([place])   Hypomagnesemia   Chronic pain syndrome   Protein-calorie malnutrition, severe ([place])    Discharge Instructions   Allergies as of 04/22/2020      Reactions   Doxycycline    Severe arthralgia   Sulfa Antibiotics    hallucinations   Sulfamethoxazole    hallucinations    Glycopyrrolate-formoterol Palpitations   Patient reports immediate palpitations after use      Medication List    TAKE these medications   acetaminophen 325 MG tablet Commonly known as: TYLENOL Take 650 mg by mouth every 6 (six) hours as needed  for pain.   albuterol (2.5 MG/3ML) 0.083% nebulizer solution Commonly known as: PROVENTIL Inhale 2.5 mg into the lungs every 4 (four) hours as needed. wheezing or shortness of breath   albuterol 108 (90 Base) MCG/ACT inhaler Commonly known as: VENTOLIN HFA Inhale 2 puffs into the lungs every 4 (four) hours as needed for wheezing.   budesonide 3 MG 24 hr capsule Commonly known as: ENTOCORT EC Take 9 mg by mouth in the morning.   calcium citrate 950 (200 Ca) MG tablet Commonly known as: CALCITRATE - dosed in  mg elemental calcium Take 400 mg of elemental calcium by mouth in the morning and at bedtime.   enoxaparin 30 MG/0.3ML injection Commonly known as: LOVENOX Inject 0.3 mLs (30 mg total) into the skin daily for 14 days. Start taking on: April 23, 2020   furosemide 20 MG tablet Commonly known as: LASIX Take 60 mg by mouth daily.   LORazepam 0.5 MG tablet Commonly known as: ATIVAN Take 0.5 mg by mouth in the morning, at noon, in the evening, and at bedtime.   mirtazapine 45 MG tablet Commonly known as: REMERON Take 45 mg by mouth at bedtime.   morphine 15 MG 12 hr tablet Commonly known as: MS CONTIN Take 1 tablet (15 mg total) by mouth every 12 (twelve) hours for 10 days. What changed:   medication strength  how much to take  Another medication with the same name was removed. Continue taking this medication, and follow the directions you see here.   naloxone 4 MG/0.1ML Liqd nasal spray kit Commonly known as: NARCAN Place 4 mg into the nose. AS NEEDED FOR OPIOID OVERDOSE.   OLANZapine 10 MG tablet Commonly known as: ZYPREXA Take 10 mg by mouth at bedtime.   OLANZapine 5 MG tablet Commonly known as: ZYPREXA Take 5 mg by mouth daily.   omeprazole 20 MG capsule Commonly known as: PRILOSEC Take 20 mg by mouth daily. Notes to patient: Not given during hospital stay    ondansetron 8 MG tablet Commonly known as: ZOFRAN Take 8 mg by mouth 3 (three) times daily.   OXYGEN Place 3 L into the nose continuous.   predniSONE 20 MG tablet Commonly known as: DELTASONE Take 1 tablet (20 mg total) by mouth daily with breakfast for 5 days. Start taking on: April 23, 2020   sertraline 100 MG tablet Commonly known as: ZOLOFT Take 100 mg by mouth daily.       Contact information for follow-up providers    Gordon Crow, MD Follow up in 1 week(s).   Specialty: Pulmonary Disease Contact information: [id number] Dept of [place]  [place] [address] [phone]            Contact information for after-discharge care    Destination    [facility] .   Service: Skilled Nursing Contact information: [place] [address] [phone]                 Allergies  Allergen Reactions  . Doxycycline     Severe arthralgia  . Sulfa Antibiotics     hallucinations  . Sulfamethoxazole     hallucinations   . Glycopyrrolate-Formoterol Palpitations    Patient reports immediate palpitations after use    Consultations: Orthopedics and pulmonology   Procedures/Studies: DG Chest 1 View  Result Date: 04/19/2020 CLINICAL DATA:  Shortness of breath. EXAM: CHEST  1 VIEW COMPARISON:  April 16, 2020. FINDINGS:  The heart size and mediastinal contours are within normal limits. Both lungs are clear. No pneumothorax or pleural effusion is noted. Left internal jugular Port-A-Cath is unchanged in position. The visualized skeletal structures are unremarkable. IMPRESSION: No active disease. Electronically Signed   By: Marijo Conception M.D.   On: 04/19/2020 17:43   DG Chest 1 View  Result Date: 04/16/2020 CLINICAL DATA:  Right hip pain after a fall today. EXAM: CHEST  1 VIEW COMPARISON:  None. FINDINGS: A Port-A-Cath terminates over the right atrium. The cardiac silhouette is normal in size. There is mild prominence of the central pulmonary arteries. No airspace consolidation, edema, pleural effusion, or pneumothorax is identified. Surgical clips are noted in the right upper abdomen. No acute osseous abnormality is identified. IMPRESSION: No active disease. Electronically Signed   By: Logan Bores M.D.   On: 04/16/2020 15:05   CT HEAD WO CONTRAST  Result Date: 04/19/2020 CLINICAL DATA:  Delirium. EXAM: CT HEAD WITHOUT CONTRAST TECHNIQUE: Contiguous axial images were obtained from the base of the skull through the vertex without intravenous contrast. COMPARISON:  Prior head CT 04/16/2020.  FINDINGS: Brain: Mildly motion degraded examination. There is no acute intracranial hemorrhage. No demarcated cortical infarct. No extra-axial fluid collection. No evidence of intracranial mass. No midline shift. Vascular: No hyperdense vessel. Skull: Normal. Negative for fracture or focal lesion. Sinuses/Orbits: Visualized orbits show no acute finding. Small left maxillary sinus mucous retention cyst. Trace fluid within the bilateral mastoid air cells. IMPRESSION: Mildly motion degraded examination. No evidence of acute intracranial abnormality. Electronically Signed   By: Kellie Simmering DO   On: 04/19/2020 19:46   CT Head Wo Contrast  Result Date: 04/16/2020 CLINICAL DATA:  Head trauma, minor, normal mental status. Additional history provided: Fall. EXAM: CT HEAD WITHOUT CONTRAST TECHNIQUE: Contiguous axial images were obtained from the base of the skull through the vertex without intravenous contrast. COMPARISON:  No pertinent prior exams available for comparison. FINDINGS: Brain: The examination is mildly motion degraded. There is no acute intracranial hemorrhage. No demarcated cortical infarct. No extra-axial fluid collection. No evidence of intracranial mass. No midline shift. Vascular: No hyperdense vessel. Skull: Normal. Negative for fracture or focal lesion. Sinuses/Orbits: Visualized orbits show no acute finding. Tiny bilateral maxillary sinus mucous retention cyst. Trace right mastoid effusion IMPRESSION: Motion degraded examination. No evidence of acute intracranial abnormality. Trace right mastoid effusion. Electronically Signed   By: Kellie Simmering DO   On: 04/16/2020 14:21   CT ANGIO CHEST PE W OR WO CONTRAST  Addendum Date: 04/19/2020   ADDENDUM REPORT: 04/19/2020 19:49 ADDENDUM: Study discussed by telephone with Provider Sharion Settler on 04/19/2020 at 1933 hours. We discussed surveillance of the right antecubital fossa contrast extravasation site, with a low threshold for consulting Surgery if  any alarming skin changes do occur. Electronically Signed   By: Genevie Ann M.D.   On: 04/19/2020 19:49   Result Date: 04/19/2020 CLINICAL DATA:  54 year old male with chest pain and shortness of breath. EXAM: CT ANGIOGRAPHY CHEST WITH CONTRAST TECHNIQUE: Multidetector CT imaging of the chest was performed using the standard protocol during bolus administration of intravenous contrast. Multiplanar CT image reconstructions and MIPs were obtained to evaluate the vascular anatomy. CONTRAST:  Total of 135 mL OMNIPAQUE IOHEXOL 350 MG/ML SOLN, after a portion of the initial 75 mL contrast dose extravasated in the right antecubital fossa. A portion of that contrast extravasation is visible on series 9, image 109 - roughly estimated at 50 mL. I was  contacted by the CT technologists regarding this extravasation at 1821 hours. They advised the right AC was swollen and mildly erythematous. I advised application of ice and also elevation of the extremity as possible. Appropriate EMR post extravasation order-set was also placed by the technologists. A 2nd IV was placed into the left upper extremity and ultimately the study was repeated at 1851 hours. COMPARISON:  Portable chest x-ray earlier today. FINDINGS: Cardiovascular: Ultimately good contrast bolus timing in the pulmonary arterial tree. No focal filling defect identified in the pulmonary arteries to suggest acute pulmonary embolism. No cardiomegaly or pericardial effusion. Left chest power port redemonstrated. Negative visible aorta aside from minor atherosclerosis. Mediastinum/Nodes: Negative.  No mediastinal lymphadenopathy. Lungs/Pleura: Trace bilateral layering pleural fluid. Diffuse centrilobular emphysema. Major airways are patent. There is very mild left lower lobe posterior basal segment peribronchial opacity (series 8, image 66). Mild generalized bronchial wall thickening. No consolidation. No pulmonary nodule identified. Upper Abdomen: Negative visible liver,  spleen, pancreas, adrenal glands and kidneys (some excreted IV contrast from the initial bolus noted). Mildly to moderately gas and fluid distended stomach. Musculoskeletal: Diffuse osteopenia and thoracic spinal compression fractures. The T1 thoracic level is least compressed. No significant thoracic retropulsion. The visible upper lumbar levels L1 through L3 are also compressed. Superimposed occasional chronic appearing rib fractures (right lateral 8th rib). No definite acute osseous abnormality. Partially visible right antecubital fossa contrast extravasation volume on series 9, image 109, roughly estimated at 50 mL. Review of the MIP images confirms the above findings. IMPRESSION: 1. Negative for pulmonary embolus. 2. Right antecubital fossa contrast extravasation as detailed above. 3. Extensive Emphysema (ICD10-J43.9) with mild left lower lobe peribronchial infection suspected. Trace bilateral pleural effusions. 4. Osteopenia with diffuse compression fractures throughout the visible thoracic and lumbar spine. Electronically Signed: By: Genevie Ann M.D. On: 04/19/2020 19:31   ECHOCARDIOGRAM COMPLETE  Result Date: 04/18/2020    ECHOCARDIOGRAM REPORT   Patient Name:   Gordon Rubio Date of Exam: 04/18/2020 Medical Rec #:  [id number]       Height:       69.0 in Accession #:    [id number]      Weight:       85.0 lb Date of Birth:  1965-10-27       BSA:          1.436 m Patient Age:    75 years        BP:           110/91 mmHg Patient Gender: M               HR:           108 bpm. Exam Location:  [facility] Procedure: 2D Echo, Color Doppler and Cardiac Doppler Indications:    CHF-acute systolic 323.55  History:        Patient has no prior history of Echocardiogram examinations.                 COPD.  Sonographer:    Sherrie Sport RDCS (AE) Referring Phys: [id number] FUAD ALESKEROV Diagnosing      Kate Sable MD Phys:  Sonographer Comments: Technically difficult study due to poor echo windows, no apical window and  suboptimal parasternal window. Image acquisition challenging due to COPD. IMPRESSIONS  1. Left ventricular ejection fraction, by estimation, is 40 to 45%. The left ventricle has mild to moderately decreased function. The left ventricle demonstrates global hypokinesis. Left ventricular diastolic function could not be evaluated.  2. Right ventricular systolic function is low normal. The right ventricular size is normal.  3. The mitral valve is normal in structure. No evidence of mitral valve regurgitation.  4. The aortic valve was not well visualized. Aortic valve regurgitation is not visualized.  5. The inferior vena cava is dilated in size with >50% respiratory variability, suggesting right atrial pressure of 8 mmHg. FINDINGS  Left Ventricle: Left ventricular ejection fraction, by estimation, is 40 to 45%. The left ventricle has mild to moderately decreased function. The left ventricle demonstrates global hypokinesis. The left ventricular internal cavity size was normal in size. There is no left ventricular hypertrophy. Left ventricular diastolic function could not be evaluated. Right Ventricle: The right ventricular size is normal. No increase in right ventricular wall thickness. Right ventricular systolic function is low normal. Left Atrium: Left atrial size was normal in size. Right Atrium: Right atrial size was normal in size. Pericardium: There is no evidence of pericardial effusion. Mitral Valve: The mitral valve is normal in structure. No evidence of mitral valve regurgitation. Tricuspid Valve: The tricuspid valve is normal in structure. Tricuspid valve regurgitation is not demonstrated. Aortic Valve: The aortic valve was not well visualized. Aortic valve regurgitation is not visualized. Pulmonic Valve: The pulmonic valve was not well visualized. Pulmonic valve regurgitation is not visualized. Aorta: The aortic root is normal in size and structure. Venous: The inferior vena cava is dilated in size with  greater than 50% respiratory variability, suggesting right atrial pressure of 8 mmHg. IAS/Shunts: The interatrial septum was not assessed.  LEFT VENTRICLE PLAX 2D LVIDd:         4.25 cm LVIDs:         3.55 cm LV PW:         1.02 cm LV IVS:        0.76 cm LVOT diam:     2.10 cm LVOT Area:     3.46 cm  LEFT ATRIUM         Index LA diam:    2.20 cm 1.53 cm/m                        PULMONIC VALVE AORTA                 PV Vmax:        0.40 m/s Ao Root diam: 2.50 cm PV Peak grad:   0.6 mmHg                       RVOT Peak grad: 2 mmHg   SHUNTS Systemic Diam: 2.10 cm Kate Sable MD Electronically signed by Kate Sable MD Signature Date/Time: 04/18/2020/1:33:46 PM    Final    DG HIP OPERATIVE UNILAT W OR W/O PELVIS RIGHT  Result Date: 04/18/2020 CLINICAL DATA:  ORIF of right hip fracture 33 seconds. Images: 4 EXAM: OPERATIVE RIGHT HIP (WITH PELVIS IF PERFORMED) 4 VIEWS TECHNIQUE: Fluoroscopic spot image(s) were submitted for interpretation post-operatively. COMPARISON:  None. FINDINGS: A gamma nail and intramedullary rod were placed across the right hip fracture during the study. IMPRESSION: ORIF right hip fracture as above. Electronically Signed   By: Dorise Bullion III M.D   On: 04/18/2020 12:22   DG HIP UNILAT W OR W/O PELVIS 2-3 VIEWS RIGHT  Result Date: 04/16/2020 CLINICAL DATA:  [place] on right hip. EXAM: DG HIP (WITH OR WITHOUT PELVIS) 2-3V RIGHT COMPARISON:  None. FINDINGS: Bones are diffusely demineralized. Comminuted right  intertrochanteric hip fracture noted with varus angulation. Pubic rami unremarkable. IMPRESSION: Comminuted right intertrochanteric hip fracture with varus angulation. Electronically Signed   By: Misty Stanley M.D.   On: 04/16/2020 15:05     Discharge Exam: Vitals:   04/22/20 1214 04/22/20 1227  BP: 90/79 107/70  Pulse: (!) 101 99  Resp: 18   Temp: 98.2 F (36.8 C)   SpO2: 96% 98%   Vitals:   04/22/20 0748 04/22/20 0850 04/22/20 1214 04/22/20 1227  BP: (!)  129/96  90/79 107/70  Pulse: 95  (!) 101 99  Resp: 18  18   Temp: 98.4 F (36.9 C)  98.2 F (36.8 C)   TempSrc:      SpO2: 97% 98% 96% 98%  Weight:      Height:        General: Pt is alert, awake, not in acute distress Cardiovascular: RRR, S1/S2 +, no rubs, no gallops Respiratory: CTA bilaterally, no wheezing, no rhonchi Abdominal: Soft, NT, ND, bowel sounds + Extremities: no edema, no cyanosis    The results of significant diagnostics from this hospitalization (including imaging, microbiology, ancillary and laboratory) are listed below for reference.     Microbiology: Recent Results (from the past 240 hour(s))  Resp Panel by RT-PCR (Flu A&B, Covid) Nasopharyngeal Swab     Status: None   Collection Time: 04/16/20  2:41 PM   Specimen: Nasopharyngeal Swab; Nasopharyngeal(NP) swabs in vial transport medium  Result Value Ref Range Status   SARS Coronavirus 2 by RT PCR NEGATIVE NEGATIVE Final    Comment: (NOTE) SARS-CoV-2 target nucleic acids are NOT DETECTED.  The SARS-CoV-2 RNA is generally detectable in upper respiratory specimens during the acute phase of infection. The lowest concentration of SARS-CoV-2 viral copies this assay can detect is 138 copies/mL. A negative result does not preclude SARS-Cov-2 infection and should not be used as the sole basis for treatment or other patient management decisions. A negative result may occur with  improper specimen collection/handling, submission of specimen other than nasopharyngeal swab, presence of viral mutation(s) within the areas targeted by this assay, and inadequate number of viral copies(<138 copies/mL). A negative result must be combined with clinical observations, patient history, and epidemiological information. The expected result is Negative.  Fact Sheet for Patients:  EntrepreneurPulse.com.au  Fact Sheet for Healthcare Providers:  IncredibleEmployment.be  This test is no t yet  approved or cleared by the [place] FDA and  has been authorized for detection and/or diagnosis of SARS-CoV-2 by FDA under an Emergency Use Authorization (EUA). This EUA will remain  in effect (meaning this test can be used) for the duration of the COVID-19 declaration under Section 564(b)(1) of the Act, 21 U.S.C.section 360bbb-3(b)(1), unless the authorization is terminated  or revoked sooner.       Influenza A by PCR NEGATIVE NEGATIVE Final   Influenza B by PCR NEGATIVE NEGATIVE Final    Comment: (NOTE) The Xpert Xpress SARS-CoV-2/FLU/RSV plus assay is intended as an aid in the diagnosis of influenza from Nasopharyngeal swab specimens and should not be used as a sole basis for treatment. Nasal washings and aspirates are unacceptable for Xpert Xpress SARS-CoV-2/FLU/RSV testing.  Fact Sheet for Patients: EntrepreneurPulse.com.au  Fact Sheet for Healthcare Providers: IncredibleEmployment.be  This test is not yet approved or cleared by the [place] FDA and has been authorized for detection and/or diagnosis of SARS-CoV-2 by FDA under an Emergency Use Authorization (EUA). This EUA will remain in effect (meaning this test can be used)  for the duration of the COVID-19 declaration under Section 564(b)(1) of the Act, 21 U.S.C. section 360bbb-3(b)(1), unless the authorization is terminated or revoked.  Performed at [facility], [address] [place], [place] [address]   CULTURE, BLOOD (ROUTINE X 2) w Reflex to ID Panel     Status: None (Preliminary result)   Collection Time: 04/19/20  4:37 PM   Specimen: BLOOD  Result Value Ref Range Status   Specimen Description BLOOD BLOOD RIGHT HAND  Final   Special Requests   Final    BOTTLES DRAWN AEROBIC AND ANAEROBIC Blood Culture adequate volume   Culture   Final    NO GROWTH 3 DAYS Performed at [facility], [address], [place], [place] [address]    Report Status  PENDING  Incomplete  CULTURE, BLOOD (ROUTINE X 2) w Reflex to ID Panel     Status: None (Preliminary result)   Collection Time: 04/19/20  4:43 PM   Specimen: BLOOD  Result Value Ref Range Status   Specimen Description BLOOD BLOOD LEFT HAND  Final   Special Requests   Final    BOTTLES DRAWN AEROBIC AND ANAEROBIC Blood Culture adequate volume   Culture   Final    NO GROWTH 3 DAYS Performed at [facility], [address] [place], [place] [address]    Report Status PENDING  Incomplete  Resp Panel by RT-PCR (Flu A&B, Covid) Nasopharyngeal Swab     Status: None   Collection Time: 04/22/20  8:26 AM   Specimen: Nasopharyngeal Swab; Nasopharyngeal(NP) swabs in vial transport medium  Result Value Ref Range Status   SARS Coronavirus 2 by RT PCR NEGATIVE NEGATIVE Final    Comment: (NOTE) SARS-CoV-2 target nucleic acids are NOT DETECTED.  The SARS-CoV-2 RNA is generally detectable in upper respiratory specimens during the acute phase of infection. The lowest concentration of SARS-CoV-2 viral copies this assay can detect is 138 copies/mL. A negative result does not preclude SARS-Cov-2 infection and should not be used as the sole basis for treatment or other patient management decisions. A negative result may occur with  improper specimen collection/handling, submission of specimen other than nasopharyngeal swab, presence of viral mutation(s) within the areas targeted by this assay, and inadequate number of viral copies(<138 copies/mL). A negative result must be combined with clinical observations, patient history, and epidemiological information. The expected result is Negative.  Fact Sheet for Patients:  EntrepreneurPulse.com.au  Fact Sheet for Healthcare Providers:  IncredibleEmployment.be  This test is no t yet approved or cleared by the [place] FDA and  has been authorized for detection and/or diagnosis of SARS-CoV-2 by FDA under an  Emergency Use Authorization (EUA). This EUA will remain  in effect (meaning this test can be used) for the duration of the COVID-19 declaration under Section 564(b)(1) of the Act, 21 U.S.C.section 360bbb-3(b)(1), unless the authorization is terminated  or revoked sooner.       Influenza A by PCR NEGATIVE NEGATIVE Final   Influenza B by PCR NEGATIVE NEGATIVE Final    Comment: (NOTE) The Xpert Xpress SARS-CoV-2/FLU/RSV plus assay is intended as an aid in the diagnosis of influenza from Nasopharyngeal swab specimens and should not be used as a sole basis for treatment. Nasal washings and aspirates are unacceptable for Xpert Xpress SARS-CoV-2/FLU/RSV testing.  Fact Sheet for Patients: EntrepreneurPulse.com.au  Fact Sheet for Healthcare Providers: IncredibleEmployment.be  This test is not yet approved or cleared by the [place] FDA and has been authorized for detection  and/or diagnosis of SARS-CoV-2 by FDA under an Emergency Use Authorization (EUA). This EUA will remain in effect (meaning this test can be used) for the duration of the COVID-19 declaration under Section 564(b)(1) of the Act, 21 U.S.C. section 360bbb-3(b)(1), unless the authorization is terminated or revoked.  Performed at [facility], [address] [place], [place] [address]      Labs: BNP (last 3 results) Recent Labs    04/16/20 1348 04/19/20 1636  BNP 50.3 122.4*   Basic Metabolic Panel: Recent Labs  Lab 04/16/20 1348 04/17/20 0337 04/18/20 0549 04/19/20 0428 04/19/20 1636 04/20/20 0538 04/21/20 0620 04/22/20 0553  NA 134* 130*   < > 137 138 137 137 136  K 2.2* 3.3*   < > 4.4 5.2* 5.9* 4.9 4.1  CL 79* 77*   < > 96* 96* 95* 94* 96*  CO2 40* 41*   < > 35* 33* 35* 38* 35*  GLUCOSE 126* 112*   < > 72 132* 108* 97 78  BUN 7 7   < > 11 13 11 12 11   CREATININE 0.64 0.64   < > 0.43* 0.45* 0.40* 0.36* 0.33*  CALCIUM 5.4* 7.1*   < > 7.7* 8.0* 8.2*  8.0* 7.9*  MG 0.6* 2.0  --   --   --   --  1.4* 1.6*  PHOS 2.8 3.6  --   --   --   --  3.3 1.9*   < > = values in this interval not displayed.   Liver Function Tests: Recent Labs  Lab 04/16/20 1348 04/19/20 1636  AST 33 26  ALT 18 19  ALKPHOS 116 112  BILITOT 0.7 0.9  PROT 5.1* 5.4*  ALBUMIN 2.1* 2.1*   No results for input(s): LIPASE, AMYLASE in the last 168 hours. No results for input(s): AMMONIA in the last 168 hours. CBC: Recent Labs  Lab 04/18/20 0549 04/19/20 0428 04/19/20 1636 04/20/20 0538 04/21/20 0620 04/22/20 0553  WBC 10.1 5.9 9.1 5.5 4.0 4.1  NEUTROABS 8.9*  --   --   --   --   --   HGB 9.2* 7.8* 8.6* 7.2* 8.9* 8.8*  HCT 28.7* 24.8* 28.1* 24.3* 27.6* 27.8*  MCV 88.0 89.2 90.6 90.7 88.7 88.5  PLT 158 135* 180 144* 150 170   Cardiac Enzymes: No results for input(s): CKTOTAL, CKMB, CKMBINDEX, TROPONINI in the last 168 hours. BNP: Invalid input(s): POCBNP CBG: No results for input(s): GLUCAP in the last 168 hours. D-Dimer No results for input(s): DDIMER in the last 72 hours. Hgb A1c No results for input(s): HGBA1C in the last 72 hours. Lipid Profile No results for input(s): CHOL, HDL, LDLCALC, TRIG, CHOLHDL, LDLDIRECT in the last 72 hours. Thyroid function studies No results for input(s): TSH, T4TOTAL, T3FREE, THYROIDAB in the last 72 hours.  Invalid input(s): FREET3 Anemia work up No results for input(s): VITAMINB12, FOLATE, FERRITIN, TIBC, IRON, RETICCTPCT in the last 72 hours. Urinalysis No results found for: COLORURINE, APPEARANCEUR, [place], [place], GLUCOSEU, [place], [place], [place], PROTEINUR, UROBILINOGEN, NITRITE, LEUKOCYTESUR Sepsis Labs Invalid input(s): PROCALCITONIN,  WBC,  LACTICIDVEN Microbiology Recent Results (from the past 240 hour(s))  Resp Panel by RT-PCR (Flu A&B, Covid) Nasopharyngeal Swab     Status: None   Collection Time: 04/16/20  2:41 PM   Specimen: Nasopharyngeal Swab; Nasopharyngeal(NP) swabs in vial transport  medium  Result Value Ref Range Status   SARS Coronavirus 2 by RT PCR NEGATIVE NEGATIVE Final    Comment: (NOTE) SARS-CoV-2 target  nucleic acids are NOT DETECTED.  The SARS-CoV-2 RNA is generally detectable in upper respiratory specimens during the acute phase of infection. The lowest concentration of SARS-CoV-2 viral copies this assay can detect is 138 copies/mL. A negative result does not preclude SARS-Cov-2 infection and should not be used as the sole basis for treatment or other patient management decisions. A negative result may occur with  improper specimen collection/handling, submission of specimen other than nasopharyngeal swab, presence of viral mutation(s) within the areas targeted by this assay, and inadequate number of viral copies(<138 copies/mL). A negative result must be combined with clinical observations, patient history, and epidemiological information. The expected result is Negative.  Fact Sheet for Patients:  EntrepreneurPulse.com.au  Fact Sheet for Healthcare Providers:  IncredibleEmployment.be  This test is no t yet approved or cleared by the [place] FDA and  has been authorized for detection and/or diagnosis of SARS-CoV-2 by FDA under an Emergency Use Authorization (EUA). This EUA will remain  in effect (meaning this test can be used) for the duration of the COVID-19 declaration under Section 564(b)(1) of the Act, 21 U.S.C.section 360bbb-3(b)(1), unless the authorization is terminated  or revoked sooner.       Influenza A by PCR NEGATIVE NEGATIVE Final   Influenza B by PCR NEGATIVE NEGATIVE Final    Comment: (NOTE) The Xpert Xpress SARS-CoV-2/FLU/RSV plus assay is intended as an aid in the diagnosis of influenza from Nasopharyngeal swab specimens and should not be used as a sole basis for treatment. Nasal washings and aspirates are unacceptable for Xpert Xpress SARS-CoV-2/FLU/RSV testing.  Fact Sheet for  Patients: EntrepreneurPulse.com.au  Fact Sheet for Healthcare Providers: IncredibleEmployment.be  This test is not yet approved or cleared by the [place] FDA and has been authorized for detection and/or diagnosis of SARS-CoV-2 by FDA under an Emergency Use Authorization (EUA). This EUA will remain in effect (meaning this test can be used) for the duration of the COVID-19 declaration under Section 564(b)(1) of the Act, 21 U.S.C. section 360bbb-3(b)(1), unless the authorization is terminated or revoked.  Performed at [facility], [address] [place], [place] [address]   CULTURE, BLOOD (ROUTINE X 2) w Reflex to ID Panel     Status: None (Preliminary result)   Collection Time: 04/19/20  4:37 PM   Specimen: BLOOD  Result Value Ref Range Status   Specimen Description BLOOD BLOOD RIGHT HAND  Final   Special Requests   Final    BOTTLES DRAWN AEROBIC AND ANAEROBIC Blood Culture adequate volume   Culture   Final    NO GROWTH 3 DAYS Performed at [facility], [address], [place], [place] [address]    Report Status PENDING  Incomplete  CULTURE, BLOOD (ROUTINE X 2) w Reflex to ID Panel     Status: None (Preliminary result)   Collection Time: 04/19/20  4:43 PM   Specimen: BLOOD  Result Value Ref Range Status   Specimen Description BLOOD BLOOD LEFT HAND  Final   Special Requests   Final    BOTTLES DRAWN AEROBIC AND ANAEROBIC Blood Culture adequate volume   Culture   Final    NO GROWTH 3 DAYS Performed at [facility], [address] [place], [place] [address]    Report Status PENDING  Incomplete  Resp Panel by RT-PCR (Flu A&B, Covid) Nasopharyngeal Swab     Status: None   Collection Time: 04/22/20  8:26 AM   Specimen: Nasopharyngeal Swab; Nasopharyngeal(NP) swabs in vial transport medium  Result Value Ref Range Status   SARS Coronavirus 2 by RT PCR NEGATIVE NEGATIVE Final    Comment: (NOTE) SARS-CoV-2  target nucleic acids are NOT DETECTED.  The SARS-CoV-2 RNA is generally detectable in upper respiratory specimens during the acute phase of infection. The lowest concentration of SARS-CoV-2 viral copies this assay can detect is 138 copies/mL. A negative result does not preclude SARS-Cov-2 infection and should not be used as the sole basis for treatment or other patient management decisions. A negative result may occur with  improper specimen collection/handling, submission of specimen other than nasopharyngeal swab, presence of viral mutation(s) within the areas targeted by this assay, and inadequate number of viral copies(<138 copies/mL). A negative result must be combined with clinical observations, patient history, and epidemiological information. The expected result is Negative.  Fact Sheet for Patients:  EntrepreneurPulse.com.au  Fact Sheet for Healthcare Providers:  IncredibleEmployment.be  This test is no t yet approved or cleared by the [place] FDA and  has been authorized for detection and/or diagnosis of SARS-CoV-2 by FDA under an Emergency Use Authorization (EUA). This EUA will remain  in effect (meaning this test can be used) for the duration of the COVID-19 declaration under Section 564(b)(1) of the Act, 21 U.S.C.section 360bbb-3(b)(1), unless the authorization is terminated  or revoked sooner.       Influenza A by PCR NEGATIVE NEGATIVE Final   Influenza B by PCR NEGATIVE NEGATIVE Final    Comment: (NOTE) The Xpert Xpress SARS-CoV-2/FLU/RSV plus assay is intended as an aid in the diagnosis of influenza from Nasopharyngeal swab specimens and should not be used as a sole basis for treatment. Nasal washings and aspirates are unacceptable for Xpert Xpress SARS-CoV-2/FLU/RSV testing.  Fact Sheet for Patients: EntrepreneurPulse.com.au  Fact Sheet for Healthcare  Providers: IncredibleEmployment.be  This test is not yet approved or cleared by the [place] FDA and has been authorized for detection and/or diagnosis of SARS-CoV-2 by FDA under an Emergency Use Authorization (EUA). This EUA will remain in effect (meaning this test can be used) for the duration of the COVID-19 declaration under Section 564(b)(1) of the Act, 21 U.S.C. section 360bbb-3(b)(1), unless the authorization is terminated or revoked.  Performed at [facility], [address], [place], [place] [address]      Time coordinating discharge: Over 30 minutes  SIGNED:   Darliss Cheney, MD  [facility] 04/22/2020, 2:51 PM  If 7PM-7AM, please contact night-coverage [email or web address]

## 2020-04-22 NOTE — Discharge Instructions (Signed)
Hip Fracture  A hip fracture is a break in the upper part of the thigh bone (femur). This is usually the result of an injury, commonly a fall. What are the causes? This condition may be caused by:  A direct hit or injury (trauma) to the side of the hip, such as from a fall or a car accident. What increases the risk? You are more likely to develop this condition if:  You have poor balance or an unsteady walking pattern (gait). Certain conditions contribute to poor balance, including Parkinson disease and dementia.  You have thinning or weakening of your bones, such as from osteopenia or osteoporosis.  You have cancer that spreads to the leg bones.  You have certain conditions that can weaken your bones, such as thyroid disorders, intestine disorders, or a lack (deficiency) of certain nutrients.  You smoke.  You take certain medicines, such as steroids.  You have a history of broken bones. What are the signs or symptoms? Symptoms of this condition include:  Pain over the injured hip. This is commonly felt on the side of the hip or in the front groin area.  Stiffness, bruising, and swelling over the hip.  Pain with movement of the leg, especially lifting it up. Pain often gets better with rest.  Difficulty or inability to stand, walk, or use the leg to support body weight (put weight on the leg).  The leg rolling outward when lying down.  The affected leg being shorter than the other leg. How is this diagnosed? This condition may be diagnosed based on:  Your symptoms.  A physical exam.  X-rays. These may be done: ? To confirm the diagnosis. ? To determine the type and location of the fracture. ? To check for other injuries.  MRI or CT scans. These may be done if the fracture is not visible on an X-ray. How is this treated? Treatment for this condition depends on the severity and location of your fracture. In most cases, surgery is necessary. Surgery may  involve:  Repairing the fracture with a screw, nail, or rod to hold the bone in place (open reduction and internal fixation, ORIF).  Replacing the damaged parts of the femur with metal implants (hemiarthroplasty or arthroplasty). If your fracture is less severe, or if you are not eligible for surgery, you may have non-surgical treatment. Non-surgical treatment may involve:  Using crutches, a walker, or a wheelchair until your health care provider says that you can support (bear) weight on your hip.  Medicines to help reduce pain and swelling.  Having regular X-rays to monitor your fracture and make sure that it is healing.  Physical therapy. You may need physical therapy after surgery, too. Follow these instructions at home: Activity  Do not use your injured leg to support your body weight until your health care provider says that you can. ? Follow standing and walking restrictions as told by your health care provider. ? Use crutches, a walker, or a wheelchair as directed.  Avoid any activities that cause pain or irritation in your hip. Ask your health care provider what activities are safe for you.  Do not drive or use heavy machinery until your health care provider approves.  If physical therapy was prescribed, do exercises as told by your health care provider. General instructions  Take over-the-counter and prescription medicines only as told by your health care provider.  If directed, put ice on the injured area: ? Put ice in a plastic bag. ?  Place a towel between your skin and the bag. ? Leave the ice on for 20 minutes, 2-3 times a day.  Do not use any products that contain nicotine or tobacco, such as cigarettes and e-cigarettes. These can delay bone healing. If you need help quitting, ask your health care provider.  Keep all follow-up visits as told by your health care provider. This is important. How is this prevented?  To prevent falls at home: ? Use a cane, walker,  or wheelchair as directed. ? Make sure your rooms and hallways are free of clutter, obstacles, and cords. ? Install grab bars in your bedroom and bathrooms. ? Always use handrails when going up and down stairs. ? Use nightlights around the house.  Exercise regularly. Ask what forms of exercise are safe for you, such as walking and strength and balance exercises.  Visit an eye doctor regularly to have your eyesight checked. This can help prevent falls.  Make sure you get enough calcium and vitamin D.  Do not use any products that contain nicotine or tobacco, such as cigarettes and e-cigarettes. If you need help quitting, ask your health care provider.  Limit alcohol use.  If you have an underlying condition that caused your hip fracture, work with your health care provider to manage your condition. Contact a health care provider if:  Your pain gets worse or it does not get better with rest or medicine.  You develop any of the following in your leg or foot: ? Numbness. ? Tingling. ? A change in skin color (discoloration). ? Skin feeling cold to the touch. Get help right away if:  Your pain suddenly gets worse.  You cannot move your hip. Summary  A hip fracture is a break in the upper part of the thigh bone (femur).  Treatment typically require surgical management to restore stability and function to the hip.  Pain medicine and icing of the affected leg can help manage pain and swelling. Follow directions as told by your health care provider. This information is not intended to replace advice given to you by your health care provider. Make sure you discuss any questions you have with your health care provider. Document Revised: 01/19/2018 Document Reviewed: 06/03/2016 Elsevier Patient Education  2020 [employer].

## 2020-04-22 NOTE — Progress Notes (Signed)
[facility]) Hospital Liaison RN note:  Visited with patient in room. He is alert and oriented and in good spirits. States he is feeling very good and happy that he is transferring to [facility] for Rehab today. Discussion about revoking hospice and he understands and states he will complete his rehab and then ask for hospice services again. No other concerns or questions.  Thank you for the opportunity to participate in this patient's care.  Loney Laurence [facility] Liaison  [phone]

## 2020-04-22 NOTE — Progress Notes (Signed)
   Subjective: 4 Days Post-Op Procedure(s) (LRB): INTRAMEDULLARY (IM) NAIL INTERTROCHANTRIC (Right) Patient reports pain as moderate. Patient is well, and has had no acute complaints or problems Denies any CP, SOB, ABD pain. We will continue with physical therapy today.   Objective: Vital signs in last 24 hours: Temp:  [97.6 F (36.4 C)-98.6 F (37 C)] 98.4 F (36.9 C) (12/09 0748) Pulse Rate:  [93-102] 95 (12/09 0748) Resp:  [17-18] 18 (12/09 0748) BP: (94-129)/(66-96) 129/96 (12/09 0748) SpO2:  [95 %-100 %] 98 % (12/09 0850)  Intake/Output from previous day: 12/08 0701 - 12/09 0700 In: 100 [IV Piggyback:100] Out: 700 [Urine:700] Intake/Output this shift: Total I/O In: 240 [P.O.:240] Out: -   Recent Labs    04/19/20 1636 04/20/20 0538 04/21/20 0620 04/22/20 0553  HGB 8.6* 7.2* 8.9* 8.8*   Recent Labs    04/21/20 0620 04/22/20 0553  WBC 4.0 4.1  RBC 3.11* 3.14*  HCT 27.6* 27.8*  PLT 150 170   Recent Labs    04/21/20 0620 04/22/20 0553  NA 137 136  K 4.9 4.1  CL 94* 96*  CO2 38* 35*  BUN 12 11  CREATININE 0.36* 0.33*  GLUCOSE 97 78  CALCIUM 8.0* 7.9*   No results for input(s): LABPT, INR in the last 72 hours.  EXAM General - Patient is Alert, Appropriate and Oriented Extremity - Neurovascular intact Sensation intact distally Intact pulses distally Dorsiflexion/Plantar flexion intact No cellulitis present Compartment soft Dressing - dressing C/D/I and moderate drainage Motor Function - intact, moving foot and toes well on exam.   Past Medical History:  Diagnosis Date  . Cancer ([place])   . Chronic pain syndrome   . COPD (chronic obstructive pulmonary disease) ([place])   . Crohn's disease ([place])   . 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   Weight as of this encounter: 38.6 kg. Advance diet Up with therapy  VSS Acute post op blood loss anemia with underlying chronic anemia - Hgb 8.8, stable.  Care manager to assist with discharge to [facility]   Patient to have staples removed and steri strips applied on 05/02/2020.  Follow up with [facility] in 6 weeks for xray of Right femur Lovenox 30 mg [place] daily x 14 days at discharge TED hose BLE x 6 weeks  DVT Prophylaxis - Lovenox, TED hose and SCDs Weight-Bearing as tolerated to right leg   T. Rachelle Hora, PA-C [place] 04/22/2020, 10:58 AM

## 2020-04-22 NOTE — Plan of Care (Signed)

## 2020-04-22 NOTE — Progress Notes (Signed)
Pulmonary Medicine          Date: 04/22/2020,   MRN# [id number] VOYD GROFT [birth year]     AdmissionWeight: 38.6 kg                 CurrentWeight: 38.6 kg Referring physician: Dr. Blaine Hamper     CHIEF COMPLAINT:   Advanced centrilobular emphysema with requirement pulmonary for presurgical evaluation   HISTORY OF PRESENT ILLNESS   Pleasant 54 year old male with centrilobular and panlobular emphysema chronic hypoxemia with 3 L O2 requirement at baseline as well as alpha-1 antitrypsin deficiency, history of Crohn's disease, chemotherapy-induced neuropathy, rheumatoid arthritis, osteoporosis, nephrolithiasis and T-cell lymphoma, depression, chronic pain syndrome, protein calorie malnutrition of moderate severity, history of lifelong smoking, chronic diarrhea, is currently with hospice and presents to the hospital after mechanical fall status post right hip fracture.  In the ER he was found to have relatively unimpressive CBC and BMP chest x-ray was done with emphysematous appearing lungs bilaterally however without pulmonary edema pneumothorax or consolidated infiltrate, COVID-19 was negative and vitals relatively stable on home O2 requirement.  Patient was evaluated by orthopedic surgery with discovery of high intratrochanteric hip fracture with likely requiring surgical intervention for repair.  Pulmonary consultation was placed for presurgical evaluation due to complex and advanced pulmonary comorbid history.  -After discussion with patient regarding risk factor and specifically his high risk of post operative respiratory failure patient explains that he has had numerous surgeries in the past and had no issues and this time he is in severe pain and immobilized so he insists that operation be done as he would not wish to live with such severe pain nor inability to ambulate.     04/19/20- patient is s/p surgery, he has transient hypotension and is with labored breathing but he is  lucid and answered all questions accurately on 1st attempt.  He has ABG ordered.  I will oder neb tx and solumedrol , discussed with RT Sullivan Lone. Patient has BIPAP NIV trelegy at home.   04/21/20- patient is improved he is more alert and is able to take couple of steps to chair and sat OOB.  He is going to [facility] rehab.   04/22/20- patient is opitimzed for d/c home. Please continue medrol dose pack. Upon d/c  pl  Spirometric data 04/17/20  Media Information   Document Information    PAST MEDICAL HISTORY   Past Medical History:  Diagnosis Date  . Cancer ([place])   . Chronic pain syndrome   . COPD (chronic obstructive pulmonary disease) ([place])   . Crohn's disease ([place])   . 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 BOWEL RESECTION    . INTRAMEDULLARY (IM) NAIL INTERTROCHANTERIC Right 04/18/2020   Procedure: INTRAMEDULLARY (IM) NAIL INTERTROCHANTRIC;  Surgeon: Hessie Knows, MD;  Location: [facility] ORS;  Service: Orthopedics;  Laterality: Right;     FAMILY HISTORY   Family History  Problem Relation Age of Onset  . COPD Mother   . Stroke Father      SOCIAL HISTORY   Social History   Tobacco Use  . Smoking status: Former Research scientist (life sciences)  . Smokeless tobacco: Never Used  Substance Use Topics  . Alcohol use: Not Currently  . Drug use: Never     MEDICATIONS    Home Medication:    Current Medication:  Current Facility-Administered Medications:  .  0.9 %  sodium  chloride infusion, 250 mL, Intravenous, Continuous, Jennye Boroughs, MD, Paused at 04/18/20 1559 .  acetaminophen (TYLENOL) tablet 650 mg, 650 mg, Oral, Q6H, Masoud, Hannah, MD, 650 mg at 04/22/20 1233 .  albuterol (VENTOLIN HFA) 108 (90 Base) MCG/ACT inhaler 2 puff, 2 puff, Inhalation, Q4H PRN, Hessie Knows, MD, 2 puff at 04/17/20 1032 .  alum & mag hydroxide-simeth (MAALOX/MYLANTA) 200-200-20 MG/5ML suspension 30 mL, 30 mL, Oral, Q4H PRN, Hessie Knows, MD .  azithromycin [facility]) tablet 250 mg, 250 mg, Oral, Daily, George Hugh, MD, 250 mg at 04/22/20 0939 .  bisacodyl (DULCOLAX) suppository 10 mg, 10 mg, Rectal, Daily PRN, Hessie Knows, MD .  calcium citrate (CALCITRATE - dosed in mg elemental calcium) tablet 400 mg of elemental calcium, 400 mg of elemental calcium, Oral, Daily, Hessie Knows, MD, 400 mg of elemental calcium at 04/22/20 0939 .  Chlorhexidine Gluconate Cloth 2 % PADS 6 each, 6 each, Topical, Daily, George Hugh, MD, 6 each at 04/22/20 1039 .  dextromethorphan-guaiFENesin (MUCINEX DM) 30-600 MG per 12 hr tablet 1 tablet, 1 tablet, Oral, BID PRN, Hessie Knows, MD .  docusate sodium (COLACE) capsule 100 mg, 100 mg, Oral, BID, Hessie Knows, MD, 100 mg at 04/22/20 0939 .  enoxaparin (LOVENOX) injection 30 mg, 30 mg, Subcutaneous, Q24H, Hessie Knows, MD, 30 mg at 04/22/20 0750 .  feeding supplement (ENSURE ENLIVE / ENSURE PLUS) liquid 237 mL, 237 mL, Oral, TID BM, George Hugh, MD, 237 mL at 04/22/20 0939 .  ferrous [id number] C-folic acid (TRINSICON / FOLTRIN) capsule 1 capsule, 1 capsule, Oral, BID, Duanne Guess, PA-C, 1 capsule at 04/22/20 0939 .  ipratropium-albuterol (DUONEB) 0.5-2.5 (3) MG/3ML nebulizer solution 3 mL, 3 mL, Nebulization, TID, Lanney Gins, Muscab Brenneman, MD, 3 mL at 04/22/20 0850 .  LORazepam (ATIVAN) tablet 0.25 mg, 0.25 mg, Oral, Q6H PRN, George Hugh, MD .  magnesium citrate solution 1 Bottle, 1 Bottle, Oral, Once PRN, Hessie Knows, MD .  magnesium hydroxide (MILK OF MAGNESIA) suspension 30 mL, 30 mL, Oral, Daily PRN, Hessie Knows, MD .  menthol-cetylpyridinium (CEPACOL) lozenge 3 mg, 1 lozenge, Oral, PRN **OR** phenol (CHLORASEPTIC) mouth spray 1 spray, 1 spray, Mouth/Throat, PRN, Hessie Knows, MD .  methocarbamol (ROBAXIN) tablet 500 mg, 500 mg, Oral, Q8H PRN, Hessie Knows, MD, 500 mg at 04/19/20 0930 .  methylPREDNISolone sodium succinate (SOLU-MEDROL) 40 mg/mL injection 40 mg,  40 mg, Intravenous, Q24H, Malyssa Maris, MD .  metoCLOPramide (REGLAN) tablet 5-10 mg, 5-10 mg, Oral, Q8H PRN **OR** metoCLOPramide (REGLAN) injection 5-10 mg, 5-10 mg, Intravenous, Q8H PRN, Hessie Knows, MD .  midodrine (PROAMATINE) tablet 5 mg, 5 mg, Oral, TID WC, Masoud, Hannah, MD, 5 mg at 04/22/20 1229 .  mirtazapine (REMERON) tablet 45 mg, 45 mg, Oral, QHS, Hessie Knows, MD, 45 mg at 04/21/20 2129 .  morphine (MS CONTIN) 12 hr tablet 15 mg, 15 mg, Oral, Q12H, George Hugh, MD, 15 mg at 04/22/20 0939 .  morphine 2 MG/ML injection 1 mg, 1 mg, Intravenous, Q1H PRN, George Hugh, MD, 1 mg at 04/21/20 1025 .  multivitamin with minerals tablet 1 tablet, 1 tablet, Oral, Daily, George Hugh, MD, 1 tablet at 04/22/20 0939 .  OLANZapine (ZYPREXA) tablet 10 mg, 10 mg, Oral, QHS, Hessie Knows, MD, 10 mg at 04/21/20 2130 .  OLANZapine (ZYPREXA) tablet 5 mg, 5 mg, Oral, Daily, Hessie Knows, MD, 5 mg at 04/20/20 2100 .  ondansetron (ZOFRAN) tablet 4 mg, 4 mg, Oral, Q6H PRN **OR** ondansetron (ZOFRAN) injection 4 mg, 4 mg,  Intravenous, Q6H PRN, Hessie Knows, MD .  oxyCODONE (Oxy IR/ROXICODONE) immediate release tablet 15 mg, 15 mg, Oral, Q4H PRN, Hessie Knows, MD, 15 mg at 04/22/20 1233 .  pantoprazole (PROTONIX) EC tablet 40 mg, 40 mg, Oral, Daily, Hessie Knows, MD, 40 mg at 04/22/20 0939 .  potassium & sodium phosphates (PHOS-NAK) 280-160-250 MG packet 1 packet, 1 packet, Oral, TID AC & HS, Darliss Cheney, MD, 1 packet at 04/22/20 1234 .  senna-docusate (Senokot-S) tablet 1 tablet, 1 tablet, Oral, QHS PRN, Hessie Knows, MD .  sertraline (ZOLOFT) tablet 100 mg, 100 mg, Oral, Daily, Hessie Knows, MD, 100 mg at 04/21/20 2129 .  sodium chloride flush (NS) 0.9 % injection 10-40 mL, 10-40 mL, Intracatheter, Q12H, Lanney Gins, Wendee Hata, MD, 10 mL at 04/22/20 1039 .  sodium chloride flush (NS) 0.9 % injection 10-40 mL, 10-40 mL, Intracatheter, PRN, Ottie Glazier, MD    ALLERGIES   Doxycycline,  Sulfa antibiotics, Sulfamethoxazole, and Glycopyrrolate-formoterol     REVIEW OF SYSTEMS    Review of Systems:  Gen:  Denies  fever, sweats, chills weigh loss  HEENT: Denies blurred vision, double vision, ear pain, eye pain, hearing loss, nose bleeds, sore throat Cardiac:  No dizziness, chest pain or heaviness, chest tightness,edema Resp:   Denies cough or sputum porduction, shortness of breath,wheezing, hemoptysis,  Gi: Denies swallowing difficulty, stomach pain, nausea or vomiting, diarrhea, constipation, bowel incontinence Gu:  Denies bladder incontinence, burning urine Ext:   Denies Joint pain, stiffness or swelling Skin: Denies  skin rash, easy bruising or bleeding or hives Endoc:  Denies polyuria, polydipsia , polyphagia or weight change Psych:   Denies depression, insomnia or hallucinations   Other: reports pain severe and improved with IV daulaudid, severe hip pain post fracture   VS: BP 107/70 (BP Location: Left Arm)   Pulse 99   Temp 98.2 F (36.8 C)   Resp 18   Ht 5\' 9"  (1.753 m)   Wt 38.6 kg   SpO2 98%   BMI 12.55 kg/m      PHYSICAL EXAM    GENERAL:NAD, no fevers, chills, no weakness no fatigue HEAD: Normocephalic, atraumatic.  EYES: Pupils equal, round, reactive to light. Extraocular muscles intact. No scleral icterus.  MOUTH: Moist mucosal membrane. Dentition intact. No abscess noted.  EAR, NOSE, THROAT: Clear without exudates. No external lesions.  NECK: Supple. No thyromegaly. No nodules. No JVD.  PULMONARY: decreased bs bilaterally no wheezing no rhonchi CARDIOVASCULAR: S1 and S2. Regular rate and rhythm. No murmurs, rubs, or gallops. No edema. Pedal pulses 2+ bilaterally.  GASTROINTESTINAL: Soft, nontender, nondistended. No masses. Positive bowel sounds. No hepatosplenomegaly.  MUSCULOSKELETAL:broken r hip with pain and tenderness NEUROLOGIC: Cranial nerves II through XII are intact. No gross focal neurological deficits. Sensation intact. Reflexes  intact.  SKIN: No ulceration, lesions, rashes, or cyanosis. Skin warm and dry. Turgor intact.  PSYCHIATRIC: Mood, affect within normal limits. The patient is awake, alert and oriented x 3. Insight, judgment intact.       IMAGING    DG Chest 1 View  Result Date: 04/19/2020 CLINICAL DATA:  Shortness of breath. EXAM: CHEST  1 VIEW COMPARISON:  April 16, 2020. FINDINGS: The heart size and mediastinal contours are within normal limits. Both lungs are clear. No pneumothorax or pleural effusion is noted. Left internal jugular Port-A-Cath is unchanged in position. The visualized skeletal structures are unremarkable. IMPRESSION: No active disease. Electronically Signed   By: Marijo Conception M.D.   On: 04/19/2020 17:43  DG Chest 1 View  Result Date: 04/16/2020 CLINICAL DATA:  Right hip pain after a fall today. EXAM: CHEST  1 VIEW COMPARISON:  None. FINDINGS: A Port-A-Cath terminates over the right atrium. The cardiac silhouette is normal in size. There is mild prominence of the central pulmonary arteries. No airspace consolidation, edema, pleural effusion, or pneumothorax is identified. Surgical clips are noted in the right upper abdomen. No acute osseous abnormality is identified. IMPRESSION: No active disease. Electronically Signed   By: Logan Bores M.D.   On: 04/16/2020 15:05   CT HEAD WO CONTRAST  Result Date: 04/19/2020 CLINICAL DATA:  Delirium. EXAM: CT HEAD WITHOUT CONTRAST TECHNIQUE: Contiguous axial images were obtained from the base of the skull through the vertex without intravenous contrast. COMPARISON:  Prior head CT 04/16/2020. FINDINGS: Brain: Mildly motion degraded examination. There is no acute intracranial hemorrhage. No demarcated cortical infarct. No extra-axial fluid collection. No evidence of intracranial mass. No midline shift. Vascular: No hyperdense vessel. Skull: Normal. Negative for fracture or focal lesion. Sinuses/Orbits: Visualized orbits show no acute finding. Small left  maxillary sinus mucous retention cyst. Trace fluid within the bilateral mastoid air cells. IMPRESSION: Mildly motion degraded examination. No evidence of acute intracranial abnormality. Electronically Signed   By: Kellie Simmering DO   On: 04/19/2020 19:46   CT Head Wo Contrast  Result Date: 04/16/2020 CLINICAL DATA:  Head trauma, minor, normal mental status. Additional history provided: Fall. EXAM: CT HEAD WITHOUT CONTRAST TECHNIQUE: Contiguous axial images were obtained from the base of the skull through the vertex without intravenous contrast. COMPARISON:  No pertinent prior exams available for comparison. FINDINGS: Brain: The examination is mildly motion degraded. There is no acute intracranial hemorrhage. No demarcated cortical infarct. No extra-axial fluid collection. No evidence of intracranial mass. No midline shift. Vascular: No hyperdense vessel. Skull: Normal. Negative for fracture or focal lesion. Sinuses/Orbits: Visualized orbits show no acute finding. Tiny bilateral maxillary sinus mucous retention cyst. Trace right mastoid effusion IMPRESSION: Motion degraded examination. No evidence of acute intracranial abnormality. Trace right mastoid effusion. Electronically Signed   By: Kellie Simmering DO   On: 04/16/2020 14:21   CT ANGIO CHEST PE W OR WO CONTRAST  Addendum Date: 04/19/2020   ADDENDUM REPORT: 04/19/2020 19:49 ADDENDUM: Study discussed by telephone with Provider Sharion Settler on 04/19/2020 at 1933 hours. We discussed surveillance of the right antecubital fossa contrast extravasation site, with a low threshold for consulting Surgery if any alarming skin changes do occur. Electronically Signed   By: Genevie Ann M.D.   On: 04/19/2020 19:49   Result Date: 04/19/2020 CLINICAL DATA:  54 year old male with chest pain and shortness of breath. EXAM: CT ANGIOGRAPHY CHEST WITH CONTRAST TECHNIQUE: Multidetector CT imaging of the chest was performed using the standard protocol during bolus administration of  intravenous contrast. Multiplanar CT image reconstructions and MIPs were obtained to evaluate the vascular anatomy. CONTRAST:  Total of 135 mL OMNIPAQUE IOHEXOL 350 MG/ML SOLN, after a portion of the initial 75 mL contrast dose extravasated in the right antecubital fossa. A portion of that contrast extravasation is visible on series 9, image 109 - roughly estimated at 50 mL. I was contacted by the CT technologists regarding this extravasation at 1821 hours. They advised the right AC was swollen and mildly erythematous. I advised application of ice and also elevation of the extremity as possible. Appropriate EMR post extravasation order-set was also placed by the technologists. A 2nd IV was placed into the left upper extremity and  ultimately the study was repeated at 1851 hours. COMPARISON:  Portable chest x-ray earlier today. FINDINGS: Cardiovascular: Ultimately good contrast bolus timing in the pulmonary arterial tree. No focal filling defect identified in the pulmonary arteries to suggest acute pulmonary embolism. No cardiomegaly or pericardial effusion. Left chest power port redemonstrated. Negative visible aorta aside from minor atherosclerosis. Mediastinum/Nodes: Negative.  No mediastinal lymphadenopathy. Lungs/Pleura: Trace bilateral layering pleural fluid. Diffuse centrilobular emphysema. Major airways are patent. There is very mild left lower lobe posterior basal segment peribronchial opacity (series 8, image 66). Mild generalized bronchial wall thickening. No consolidation. No pulmonary nodule identified. Upper Abdomen: Negative visible liver, spleen, pancreas, adrenal glands and kidneys (some excreted IV contrast from the initial bolus noted). Mildly to moderately gas and fluid distended stomach. Musculoskeletal: Diffuse osteopenia and thoracic spinal compression fractures. The T1 thoracic level is least compressed. No significant thoracic retropulsion. The visible upper lumbar levels L1 through L3 are  also compressed. Superimposed occasional chronic appearing rib fractures (right lateral 8th rib). No definite acute osseous abnormality. Partially visible right antecubital fossa contrast extravasation volume on series 9, image 109, roughly estimated at 50 mL. Review of the MIP images confirms the above findings. IMPRESSION: 1. Negative for pulmonary embolus. 2. Right antecubital fossa contrast extravasation as detailed above. 3. Extensive Emphysema (ICD10-J43.9) with mild left lower lobe peribronchial infection suspected. Trace bilateral pleural effusions. 4. Osteopenia with diffuse compression fractures throughout the visible thoracic and lumbar spine. Electronically Signed: By: Genevie Ann M.D. On: 04/19/2020 19:31   ECHOCARDIOGRAM COMPLETE  Result Date: 04/18/2020    ECHOCARDIOGRAM REPORT   Patient Name:   KYON BENTLER Date of Exam: 04/18/2020 Medical Rec #:  [id number]       Height:       69.0 in Accession #:    [id number]      Weight:       85.0 lb Date of Birth:  [birth year]       BSA:          1.436 m Patient Age:    25 years        BP:           110/91 mmHg Patient Gender: M               HR:           108 bpm. Exam Location:  [facility] Procedure: 2D Echo, Color Doppler and Cardiac Doppler Indications:    CHF-acute systolic 092.33  History:        Patient has no prior history of Echocardiogram examinations.                 COPD.  Sonographer:    Sherrie Sport RDCS (AE) Referring Phys: [id number] Yazhini Mcaulay Diagnosing      Kate Sable MD Phys:  Sonographer Comments: Technically difficult study due to poor echo windows, no apical window and suboptimal parasternal window. Image acquisition challenging due to COPD. IMPRESSIONS  1. Left ventricular ejection fraction, by estimation, is 40 to 45%. The left ventricle has mild to moderately decreased function. The left ventricle demonstrates global hypokinesis. Left ventricular diastolic function could not be evaluated.  2. Right ventricular systolic function is low  normal. The right ventricular size is normal.  3. The mitral valve is normal in structure. No evidence of mitral valve regurgitation.  4. The aortic valve was not well visualized. Aortic valve regurgitation is not visualized.  5. The inferior vena cava is dilated in size with >50%  respiratory variability, suggesting right atrial pressure of 8 mmHg. FINDINGS  Left Ventricle: Left ventricular ejection fraction, by estimation, is 40 to 45%. The left ventricle has mild to moderately decreased function. The left ventricle demonstrates global hypokinesis. The left ventricular internal cavity size was normal in size. There is no left ventricular hypertrophy. Left ventricular diastolic function could not be evaluated. Right Ventricle: The right ventricular size is normal. No increase in right ventricular wall thickness. Right ventricular systolic function is low normal. Left Atrium: Left atrial size was normal in size. Right Atrium: Right atrial size was normal in size. Pericardium: There is no evidence of pericardial effusion. Mitral Valve: The mitral valve is normal in structure. No evidence of mitral valve regurgitation. Tricuspid Valve: The tricuspid valve is normal in structure. Tricuspid valve regurgitation is not demonstrated. Aortic Valve: The aortic valve was not well visualized. Aortic valve regurgitation is not visualized. Pulmonic Valve: The pulmonic valve was not well visualized. Pulmonic valve regurgitation is not visualized. Aorta: The aortic root is normal in size and structure. Venous: The inferior vena cava is dilated in size with greater than 50% respiratory variability, suggesting right atrial pressure of 8 mmHg. IAS/Shunts: The interatrial septum was not assessed.  LEFT VENTRICLE PLAX 2D LVIDd:         4.25 cm LVIDs:         3.55 cm LV PW:         1.02 cm LV IVS:        0.76 cm LVOT diam:     2.10 cm LVOT Area:     3.46 cm  LEFT ATRIUM         Index LA diam:    2.20 cm 1.53 cm/m                         PULMONIC VALVE AORTA                 PV Vmax:        0.40 m/s Ao Root diam: 2.50 cm PV Peak grad:   0.6 mmHg                       RVOT Peak grad: 2 mmHg   SHUNTS Systemic Diam: 2.10 cm Kate Sable MD Electronically signed by Kate Sable MD Signature Date/Time: 04/18/2020/1:33:46 PM    Final    DG HIP OPERATIVE UNILAT W OR W/O PELVIS RIGHT  Result Date: 04/18/2020 CLINICAL DATA:  ORIF of right hip fracture 33 seconds. Images: 4 EXAM: OPERATIVE RIGHT HIP (WITH PELVIS IF PERFORMED) 4 VIEWS TECHNIQUE: Fluoroscopic spot image(s) were submitted for interpretation post-operatively. COMPARISON:  None. FINDINGS: A gamma nail and intramedullary rod were placed across the right hip fracture during the study. IMPRESSION: ORIF right hip fracture as above. Electronically Signed   By: Dorise Bullion III M.D   On: 04/18/2020 12:22   DG HIP UNILAT W OR W/O PELVIS 2-3 VIEWS RIGHT  Result Date: 04/16/2020 CLINICAL DATA:  [place] on right hip. EXAM: DG HIP (WITH OR WITHOUT PELVIS) 2-3V RIGHT COMPARISON:  None. FINDINGS: Bones are diffusely demineralized. Comminuted right intertrochanteric hip fracture noted with varus angulation. Pubic rami unremarkable. IMPRESSION: Comminuted right intertrochanteric hip fracture with varus angulation. Electronically Signed   By: Misty Stanley M.D.   On: 04/16/2020 15:05         ASSESSMENT/PLAN    #1Advanced panlobular emphysema with chronic hypoxemia and alpha-1 antitrypsin deficiency Pulmonary  status is currently close to baseline -Recommend incentive spirometry and chest physiotherapy throughout hospitalization -Typical COPD care path without exacerbation -04/20/20-reviewed CT chest - there is no infiltrate, but there is bronchial thickening with chronic severe panlobular emphysema - continue with steroids and po zithromax for COPD exacerbation only      #2Presurgical pulmonary evaluation -Comminuted right intertrochanteric hip fracture with  varus Angulation. ARISCAT ([place]) preoperative pulmonary risk index in adults-42.1%-high risk for pulmonary intra and post operative complication [place] respiratory failure index:  High risk 10.1% risk of post operative respiratory failure [place] Safe-  Post operative Respiratory Failure Risk Calculator: 27.9% -High risk Postoperative respiratory failure (PRF)  Baseline advanced panlobular emphysema Severe protein calorie malnutrition Pulmonary hypertension per TTE 03/16/2018 -will order repeat study prior to surgery Bronchoscopy 09/19/2017 - phlegm throughout airways -Spirometry 04/17/20 - FEV1 - 11% predicted >>>>consistent with known history of advanced panlobular emphysema and COPD GOLD stage 4D - consider pulmonary transplant evaluation  -Overall patient with high pre-test probability of post operative respiratory complications - if surgery is absolutely required patient may require prolonged intubation with difficulty weaning/liberating from mechanical ventilation and may require prolonged hospitalization.      #3Acute comminuted right hip fracture   -Orthopedic surgery on case-appreciate input   -Pulmonary presurgical evaluation & medical optimization in process  -s/p surge POD- 0     Acute blood loss anemia    - this will certainly affect patients respiratory status - would recommend transfusion if hg <7.0 currently its 7.2   -s/p PRBc transfusion    Thank you for allowing me to participate in the care of this patient.   Patient/Family are satisfied with care plan and all questions have been answered.   This document was prepared using Dragon voice recognition software and may include unintentional dictation errors.     Ottie Glazier, M.D.  Division of [facility]

## 2020-04-24 LAB — CULTURE, BLOOD (ROUTINE X 2)
Culture: NO GROWTH
Culture: NO GROWTH
Special Requests: ADEQUATE
Special Requests: ADEQUATE

## 2020-04-26 DIAGNOSIS — E1100 Type 2 diabetes mellitus with hyperosmolarity without nonketotic hyperglycemic-hyperosmolar coma (NKHHC): Secondary | ICD-10-CM

## 2020-04-26 DIAGNOSIS — F39 Unspecified mood [affective] disorder: Secondary | ICD-10-CM

## 2020-04-26 DIAGNOSIS — I5022 Chronic systolic (congestive) heart failure: Secondary | ICD-10-CM

## 2020-04-26 DIAGNOSIS — J449 Chronic obstructive pulmonary disease, unspecified: Secondary | ICD-10-CM

## 2020-04-26 DIAGNOSIS — J9611 Chronic respiratory failure with hypoxia: Secondary | ICD-10-CM

## 2020-04-26 DIAGNOSIS — F1120 Opioid dependence, uncomplicated: Secondary | ICD-10-CM

## 2020-04-26 DIAGNOSIS — S7291XA Unspecified fracture of right femur, initial encounter for closed fracture: Secondary | ICD-10-CM

## 2020-04-26 DIAGNOSIS — K5090 Crohn's disease, unspecified, without complications: Secondary | ICD-10-CM

## 2020-04-30 DIAGNOSIS — B351 Tinea unguium: Secondary | ICD-10-CM | POA: Diagnosis not present

## 2020-05-31 DIAGNOSIS — S5002XA Contusion of left elbow, initial encounter: Secondary | ICD-10-CM | POA: Diagnosis not present

## 2020-07-02 DIAGNOSIS — K5090 Crohn's disease, unspecified, without complications: Secondary | ICD-10-CM

## 2020-07-02 DIAGNOSIS — F39 Unspecified mood [affective] disorder: Secondary | ICD-10-CM | POA: Diagnosis not present

## 2020-07-02 DIAGNOSIS — I5020 Unspecified systolic (congestive) heart failure: Secondary | ICD-10-CM

## 2020-07-02 DIAGNOSIS — K219 Gastro-esophageal reflux disease without esophagitis: Secondary | ICD-10-CM

## 2020-07-02 DIAGNOSIS — J069 Acute upper respiratory infection, unspecified: Secondary | ICD-10-CM

## 2020-07-02 DIAGNOSIS — F1129 Opioid dependence with unspecified opioid-induced disorder: Secondary | ICD-10-CM

## 2020-07-02 DIAGNOSIS — E43 Unspecified severe protein-calorie malnutrition: Secondary | ICD-10-CM | POA: Diagnosis not present

## 2020-07-02 DIAGNOSIS — J449 Chronic obstructive pulmonary disease, unspecified: Secondary | ICD-10-CM

## 2020-07-05 DIAGNOSIS — N202 Calculus of kidney with calculus of ureter: Secondary | ICD-10-CM

## 2020-07-29 DIAGNOSIS — J9611 Chronic respiratory failure with hypoxia: Secondary | ICD-10-CM

## 2020-07-29 DIAGNOSIS — E440 Moderate protein-calorie malnutrition: Secondary | ICD-10-CM

## 2020-07-29 DIAGNOSIS — I5022 Chronic systolic (congestive) heart failure: Secondary | ICD-10-CM

## 2020-07-29 DIAGNOSIS — F1124 Opioid dependence with opioid-induced mood disorder: Secondary | ICD-10-CM

## 2020-07-29 DIAGNOSIS — G894 Chronic pain syndrome: Secondary | ICD-10-CM

## 2020-07-29 DIAGNOSIS — F39 Unspecified mood [affective] disorder: Secondary | ICD-10-CM

## 2020-07-29 DIAGNOSIS — J449 Chronic obstructive pulmonary disease, unspecified: Secondary | ICD-10-CM

## 2020-08-13 DEATH — deceased

## 2021-08-01 IMAGING — DX DG CHEST 1V
1 series · 1 of 1 positions shown · non-contrast
Comparison: April 16, 2020.

CLINICAL DATA: Shortness of breath.

EXAM:
CHEST  1 VIEW

[chest ap]
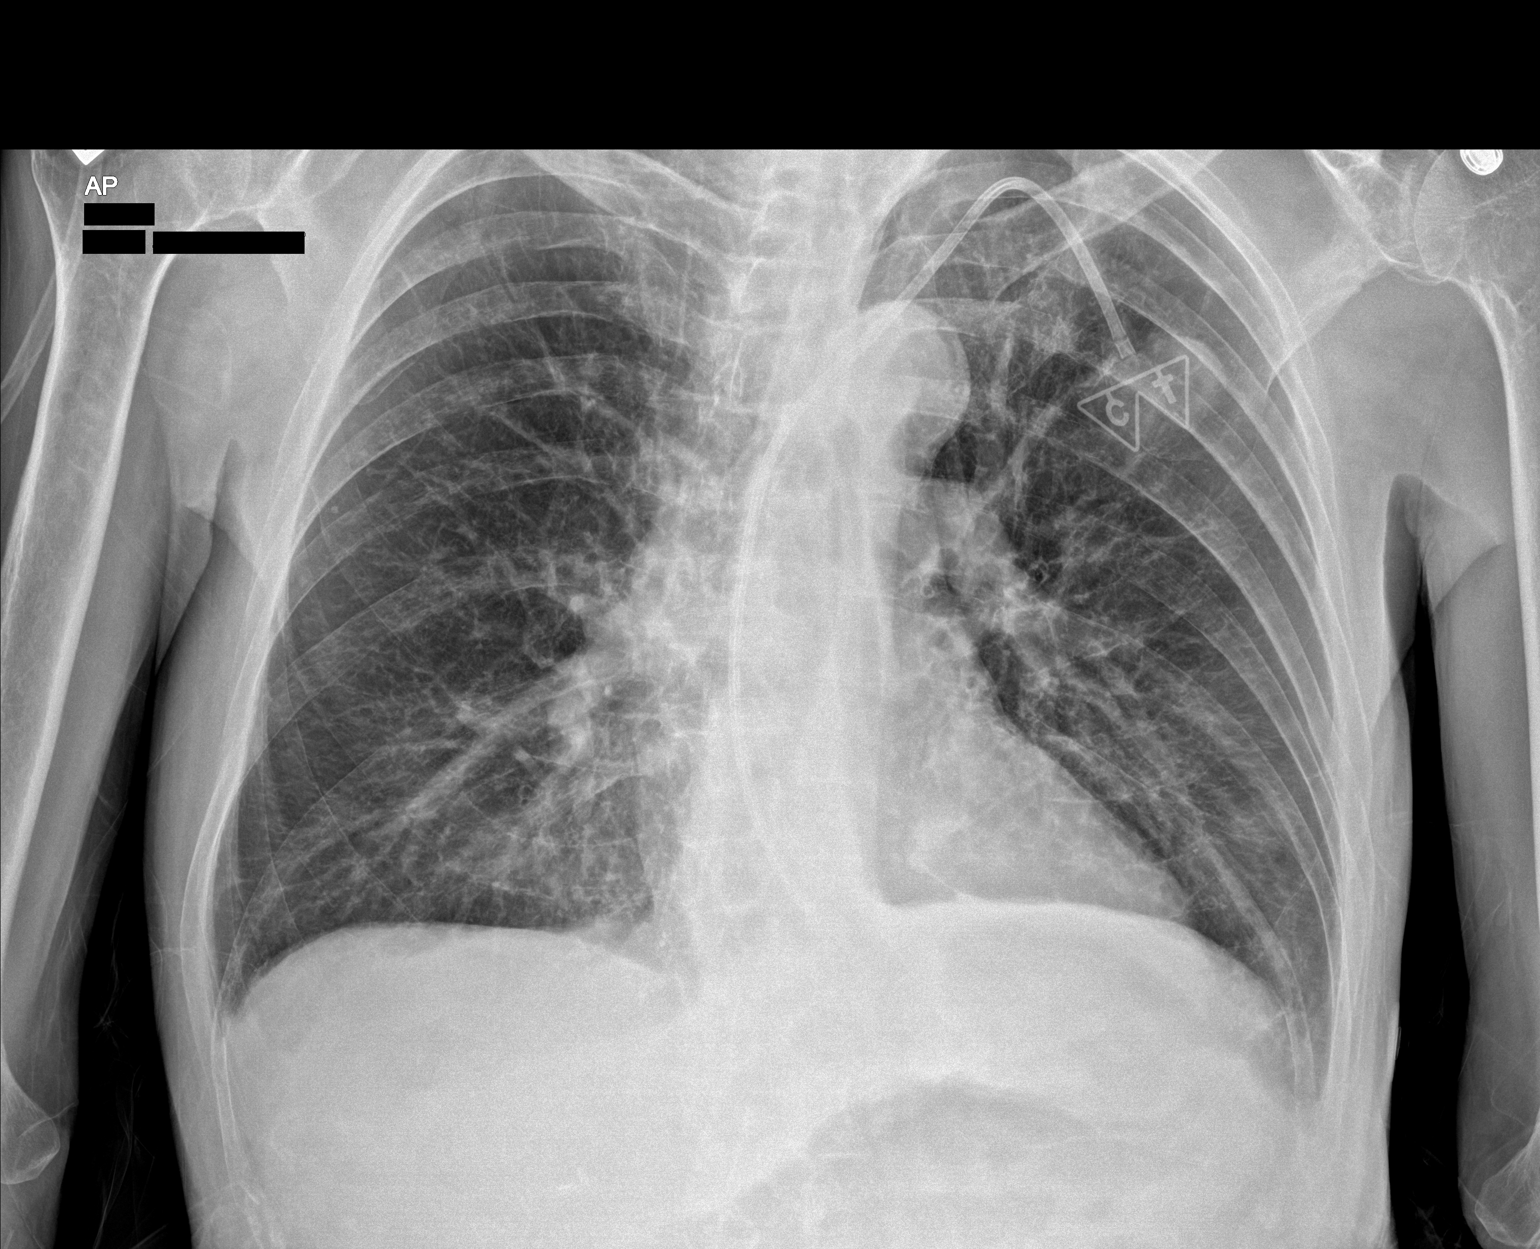

[1 of 1 positions shown; findings below may reference images not displayed]

FINDINGS: The heart size and mediastinal contours are within normal limits.
Both lungs are clear. No pneumothorax or pleural effusion is noted.
Left internal jugular Port-A-Cath is unchanged in position. The
visualized skeletal structures are unremarkable.
IMPRESSION: No active disease.
